# Patient Record
Sex: Male | Born: 1980 | Race: White | Hispanic: No | Marital: Married | Smoking: Former smoker
Health system: Southern US, Community
[De-identification: ages and names within clinical notes are randomized; demographics above are authoritative.]

## PROBLEM LIST (undated history)

## (undated) DIAGNOSIS — F419 Anxiety disorder, unspecified: Secondary | ICD-10-CM

## (undated) DIAGNOSIS — F329 Major depressive disorder, single episode, unspecified: Secondary | ICD-10-CM

## (undated) DIAGNOSIS — F32A Depression, unspecified: Secondary | ICD-10-CM

## (undated) HISTORY — DX: Major depressive disorder, single episode, unspecified: F32.9

## (undated) HISTORY — PX: OTHER SURGICAL HISTORY: SHX169

## (undated) HISTORY — DX: Depression, unspecified: F32.A

## (undated) HISTORY — DX: Anxiety disorder, unspecified: F41.9

---

## 2015-03-10 ENCOUNTER — Ambulatory Visit (INDEPENDENT_AMBULATORY_CARE_PROVIDER_SITE_OTHER): Payer: 59 | Admitting: Psychiatry

## 2015-03-10 ENCOUNTER — Encounter (INDEPENDENT_AMBULATORY_CARE_PROVIDER_SITE_OTHER): Payer: Self-pay

## 2015-03-10 ENCOUNTER — Encounter (HOSPITAL_COMMUNITY): Payer: Self-pay | Admitting: Psychiatry

## 2015-03-10 VITALS — BP 134/76 | HR 62 | Ht 66.0 in | Wt 207.0 lb

## 2015-03-10 DIAGNOSIS — F331 Major depressive disorder, recurrent, moderate: Secondary | ICD-10-CM

## 2015-03-10 DIAGNOSIS — F1010 Alcohol abuse, uncomplicated: Secondary | ICD-10-CM | POA: Diagnosis not present

## 2015-03-10 DIAGNOSIS — F1210 Cannabis abuse, uncomplicated: Secondary | ICD-10-CM | POA: Diagnosis not present

## 2015-03-10 DIAGNOSIS — F411 Generalized anxiety disorder: Secondary | ICD-10-CM

## 2015-03-10 MED ORDER — VENLAFAXINE HCL ER 37.5 MG PO CP24: ORAL_CAPSULE | ORAL | Status: DC

## 2015-03-10 NOTE — Progress Notes (Signed)
[facility] Behavioral Health Initial Assessment Note  Wesley Hudson [id number] 34 y.o. I need help.  I am very anxious and nervous. 03/10/2015 9:55 AM  Chief Complaint:  I need help I'm very anxious and nervous.  History of Present Illness:  Patient is 34 year old, Caucasian, married, employed man who was referred from his primary care physician for the management of depression and anxiety symptoms.  Patient endorse history of depression and anxiety as well as he remember however has been increased in recent years.  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  He is mainly working with limited staff and serving more than 1200 students.  In the beginning he was nervous and anxious and he has difficulty expressing his symptoms and stresses but later he was more cooperative.  He mentioned that his father died in 04-01-08 due to esophagus cancer and he had never grieved about it.  He also mentioned that his wife is [redacted] weeks pregnant he is very anxious and nervous about it.  This is her first baby and they have been married for 8 years.  He admitted stress at work causing a more depressed and anxious.  He admitted lately increase in his alcohol intake and marijuana.  He's been drinking 1-2 times a week and 3-4 times a week smoking marijuana.  He wants to stop his drugs and alcohol and he admitted that he is self-medicating to help his anxiety and nervousness.  He admitted crying spells, racing thoughts, lack of motivation, lack of attention and concentration, isolation and withdrawn.  He's been not enjoying his life which he used to by playing sports and fishing.  He has gained weight 15 pounds in past few months.  He denies any paranoia, hallucination, OCD, PTSD, but admitted some time vivid nightmares.  He denies any suicidal thoughts or homicidal thought.  He lives with his wife.  He had prescribed Zoloft last year but he stopped after 2 months because he does not see a huge  improvement.  Patient denies any mania, psychosis, aggression or violence.  He is open to try a new medication and counseling.  Suicidal Ideation: No Plan Formed: No Patient has means to carry out plan: No  Homicidal Ideation: No Plan Formed: No Patient has means to carry out plan: No  Past Psychiatric History/Hospitalization(s): Patient has history of seeing psychiatrist and therapist since age 47 when he was arrested for drug possession charges.  He was again arrested for DUI at age 67.  He was seeing Dr. Dub Mikes while he was in the school and prescribed Adderall.  Last year he was given Zoloft by his primary care physician but he stopped after 2 months.  Patient endorse history of anxiety, nervousness and depression but denies any history of mania, psychosis, hallucination, aggression or violence.  He has history of using drugs including marijuana and heavy history of alcohol. Anxiety: Yes Bipolar Disorder: No Depression: Yes Mania: No Psychosis: No Schizophrenia: No Personality Disorder: No Hospitalization for psychiatric illness: No History of Electroconvulsive Shock Therapy: No Prior Suicide Attempts: No  Medical History; Patient has no active medical problem.  His primary care physician is Dr. Clarene Duke.  He denies any history of seizures.  Traumatic brain injury: Patient denies any history of combative brain injury.  Family History; Patient endorsed multiple family member has depression and alcohol issues.  He endorse father and mother has depression.  He endorse uncle and aunt has alcohol problem.  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    Psychosocial History; Patient born in [place] but lived most of his life in [place].  His parents are well known in teaching.  His father diagnosed with esophageal cancer at early age and died in 03-31-08.  Patient has seen  his father suffering from cancer and he missed him a lot when he died.  His mother still in teaching program and currently living in [place] and involved in abroad teaching program.  Patient is married for 8 years.  His wife is expecting 6 weeks.  Patient has a younger sister who he get along very well with her.     Legal History: Patient was arrested in the past cause of drug possession and DUI.  Currently he is not on any probation.     History Of Abuse: Patient denies any history of abuse.   Substance Abuse History; Patient endorse history of drinking alcohol and smoking marijuana at early age.  He was arrested at age 60 and then again at age 42 .  He was seen psychiatrist Dr. Dub Mikes at [facility] .  He was given Adderall.    Review of Systems: Psychiatric: Agitation: No Hallucination: No Depressed Mood: Yes Insomnia: Yes Hypersomnia: No Altered Concentration: No Feels Worthless: No Grandiose Ideas: No Belief In Special Powers: No New/Increased Substance Abuse: Yes Compulsions: No  Neurologic: Headache: No Seizure: No Paresthesias: No   Musculoskeletal: Strength & Muscle Tone: within normal limits Gait & Station: normal Patient leans: N/A   Outpatient Encounter Prescriptions as of 03/10/2015  Medication Sig  . venlafaxine XR (EFFEXOR XR) 37.5 MG 24 hr capsule Take 1 capsule daily for 1 week and than 2 capsule daily    No results found for this or any previous visit (from the past 04-01-2159 hour(s)).    Constitutional:  BP 134/76 mmHg  Pulse 62  Ht  (1.676 m)  Wt 207 lb (93.895 kg)  BMI 33.43 kg/m2   Mental Status Examination;  Patient is well groomed well dressed man who appears to be in his stated age.  In the beginning he was anxious but later he was cooperative .  He maintained fair eye contact.  His his speech is clear, coherent, fluent with normal tone and volume.  He described his mood is anxious and his affect is constricted.  He was tearful during the  conversation when he was talking about his father.  He denies any auditory or visual hallucination.  He denies any active or passive suicidal thoughts or homicidal thought.  There were no delusions, paranoia or any obsessive thoughts.  Psychomotor activity is slow .  His fund of knowledge is adequate.  His thought process logical and goal-directed.  There were no flight of ideas or any loose association.  He has no EPS, tremors or shakes.  His cognition is good.  He is alert and oriented 3.  His insight judgment and impulse control is okay.     New problem, with additional work up planned, Review of Psycho-Social Stressors (1), Review or order clinical lab tests (1), Decision to obtain old records (1), Review and summation of old records (2), Established Problem, Worsening (2), New Problem, with no additional work-up planned (3), Review of Medication Regimen & Side Effects (2) and Review of New Medication or Change in Dosage (2)  Assessment: Axis I:  Major depressive disorder, recurrent  moderate.  Denies anxiety disorder.  Alcohol abuse, cannabis abuse  Axis II: Deferred  Axis III:  History reviewed. No pertinent past medical history.   Plan:   I review his symptoms, stressors, current medication and psychosocial history.  We will start low-dose Effexor to help his anxiety and depressive symptoms.  Discuss about drug and alcohol use and talk about slow recovery and interaction of a psychotropic medication.  We will also get records from his primary care physician , he has blood work in November.  I also believe patient requires counseling for increased coping and social skills.  We will schedule appointment with Scarlette CalicoFrances.  Start Effexor 37.5 mg per 1 week and gradually increase to 2 capsule a day .  Discussed medication side effects and benefits and recommended to call us back if he has any question, concern or if any worsening of the symptom.  Follow-up in 2-3 weeks Time spent 55 minutes.  More than  50% of the time spent in psychoeducation, counseling and coordination of care.  Discuss safety plan that anytime having active suicidal thoughts or homicidal thoughts then patient need to call 911 or go to the local emergency room.    Kripa Foskey T., MD 03/10/2015

## 2015-03-24 ENCOUNTER — Ambulatory Visit (INDEPENDENT_AMBULATORY_CARE_PROVIDER_SITE_OTHER): Payer: 59 | Admitting: Clinical

## 2015-03-24 ENCOUNTER — Encounter (HOSPITAL_COMMUNITY): Payer: Self-pay | Admitting: Clinical

## 2015-03-24 DIAGNOSIS — F411 Generalized anxiety disorder: Secondary | ICD-10-CM

## 2015-03-24 DIAGNOSIS — F330 Major depressive disorder, recurrent, mild: Secondary | ICD-10-CM | POA: Diagnosis not present

## 2015-03-24 NOTE — Progress Notes (Signed)
Patient:   Wesley Hudson   DOB:   08-24-1981  MR Number:  [id number]  Location:  [facility] BEHAVIORAL HE[facility] OUTPATIENT THERAPY [place] [id number] [place] [address] Dept: [phone]           Date of Service:   03/24/2015  Start Time:   8:01 End Time:   9:02  Provider/Observer:  Erby Pian Counselor       Billing Code/Service: [phone]  Behavioral Observation: Soren Lazarz  presents as a 34 y.o.-year-old White and Latino   Male who appeared his stated age. his dress was Appropriate and he was Negative, Neat and Well Groomed and his manners were Appropriate to the situation.  There were not any physical disabilities noted.  he displayed an appropriate level of cooperation and motivation.    Interactions:    Active   Attention:   normal  Memory:   normal  Speech (Volume):  normal  Speech:   normal pitch and normal volume  Thought Process:  Coherent and Relevant  Though Content:  WNL  Orientation:   person, place, time/date and situation  Judgment:   Good  Planning:   Good  Affect:    Appropriate  Mood:    Depressed  Insight:   Good  Intelligence:   normal  Chief Complaint:     Chief Complaint  Patient presents with  . Anxiety  . Depression  . Stress    Reason for Service:  Referred by Dr. 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 My father is from [place]. They lived in [place]. They wanted me to be born in [place] for various reasons." "After I was able to fly, we moved back to [place] and I grew up there until I was 5."  "I remember going to kindergarten in [place]. Also my sister born in [place]." "Then there was a lot of unrest there with the [employer], and the government. My parents felt it was unsafe to be in [place] any longer. My Dad was an Art gallery manager and we moved to [place]. We lived in [place] until 1993." "At that point Dad's health was declining, and his drinking and depression were a problem."  "When I was born Dad was diagnosed with throat cancer. He lived with it for 27 years. He had Tracheotomy and was in and out of the hospital a lot." "Also the school mom had been teaching at had gone through changes. So Dad sold his business to his partner, and Mom took a job at [employer] here in [place]. I was maybe 9  or [age] in age."  "I was angry to have to leave. Coming here was a mixture of some good things, making new friends, and struggling with stuff in the home."  "Dad was a quite drunk. He would drink too much and sleep on the couch.  Mother would get really mad and  yelling and fighting (non-violent)." "We moved into house I am currently in 1994, late summer. " I moved out when I was 17 and went to [employer] and spent first year on campus, then off campus."  "I now live with my wife. We are house sitting for Mom while she is teaching abroad. Being able to stay in her house has allowed us to  save up and plan for our family, we are really happy about that." Reuel BoomDaniel and Luz LexKeele are expecting their 1st child and Reuel BoomDaniel was promoted at work.  Natural/Informal Support:                          Mostly just myself,  Wife, sometimes Sam   Substance Use:  No concerns of substance abuse are reported.   Mariajuana use but limited use and alcohol intake is  limited, reports he is mindful because of past legal issues (5 years ago and at age 34) and because his father was an alcoholis   Medical History:  History reviewed. No pertinent past medical history.        Medication List       This list is accurate as of: 03/24/15  8:09 AM.  Always use your most recent med list.               venlafaxine XR 37.5 MG 24 hr capsule  Commonly known as:  EFFEXOR XR  Take 1 capsule daily for 1 week and than 2 capsule daily              Sexual History:   History  Sexual Activity  . Sexual Activity: Yes  . Birth Tax adviserControl/ Protection: None     Abuse/Trauma History: No childhood abuse     No abuse or trauma as an adult   Psychiatric History:  Outpatient - When in high school for anxiety and depression - a little helpful  Strengths:   'I am outgoing, I really like people and working with people, making relationships and working with others on goals and projects, humorous, I like to laugh, puns, and wit."    Recovery Goals:  "My goal is to be more incontrol of those emotional states and learn some coping mechanisms to work through that,   Hobbies/Interests:               "Reading, gaming, board word puzzle and video."   Challenges/Barriers: " Challenges for me will be sustaining those practices. Getting myself to follow through."    Family Med/Psych History:  Family History  Problem Relation Age of Onset  . Depression Mother   . Depression Father   . Depression Sister     Risk of Suicide/Violence: low  Denies any current or past suicidal ideation. As a teen sometimes thought would like to disappear.   History of Suicide/Violence:   No violence   Psychosis:   N/A  Diagnosis:    Generalized anxiety disorder  Major depressive disorder, recurrent episode, mild  Impression/DX:    [name] Biscoe  Is a 34 y.o.-year-old White and Latino, married Male who presents with Generalized Anxiety Disorder and Major Depressive Disorder recurrent  episode, mild. Kadden has a lot of new stressors such as a new job and his first child on the way which has increased his anxiety.  Orval reports that he has been experiencing anxiety since he was 8. He reports hat the anxiety began when he moved to [place] and was getting into fights with bullies. He reports " I would have a sense of impending doom on the way to school. I didn't mention it because I didn't want to add to the problems at home. I became more depressed in high school. That is also when I began to drink and smoke weed. I guess I was self medicating and that's how I dealt with those things." In my Freshman/Sophomore year at [employer] I got in trouble for drinking. I had to go to a class and also went to see Dr. Dub Mikes. He was helpful. I was also given Adderall. hey thought I had  ADHD. The Adderall helped me study and concentrate but I don't think I had ADHD. Anyway I did not persist on the medication or seeing Dr Dub Mikes. I was paying for myself by then and the cost was a problem. My parents had their own stuff going on so I tried not to burden them. I tried not to be a burden as to be no burden at all. Child reports the following anxiety symptoms: worry more than half the day, sometimes it is hard to control the worry, fatigued. He reports that he has had some panic attacks but that they are not consist ant. He reports the last one happened in January. He reports the following symptoms of panic attack chest gets tight, sweaty palms and feet. "I feel like  I am really struggling to survive."   Jamarea reports that he has experienced "a constant under the surface depression , leading up to and following my father's death." He reports while the anxiety is the main problem he does sometimes experience the following symptoms, sometimes a feeling of hopelessness, sometimes feelings of guilt about past decisions or what I should have done, an increased desire for sleep, "Ive gained weight since starting my new  job, which has also cut down on my exercise."  Denies Mania or psychosis  Recommendation/Plan: Individual therapy 1x a week to become less frequent as symptoms decrease, and follow safety plan as needed

## 2015-03-30 ENCOUNTER — Ambulatory Visit (INDEPENDENT_AMBULATORY_CARE_PROVIDER_SITE_OTHER): Payer: 59 | Admitting: Psychiatry

## 2015-03-30 ENCOUNTER — Encounter (HOSPITAL_COMMUNITY): Payer: Self-pay | Admitting: Psychiatry

## 2015-03-30 VITALS — BP 128/75 | HR 54 | Ht 72.0 in | Wt 208.6 lb

## 2015-03-30 DIAGNOSIS — F1210 Cannabis abuse, uncomplicated: Secondary | ICD-10-CM | POA: Diagnosis not present

## 2015-03-30 DIAGNOSIS — Z79899 Other long term (current) drug therapy: Secondary | ICD-10-CM

## 2015-03-30 DIAGNOSIS — F411 Generalized anxiety disorder: Secondary | ICD-10-CM

## 2015-03-30 DIAGNOSIS — F331 Major depressive disorder, recurrent, moderate: Secondary | ICD-10-CM | POA: Diagnosis not present

## 2015-03-30 DIAGNOSIS — F1010 Alcohol abuse, uncomplicated: Secondary | ICD-10-CM | POA: Diagnosis not present

## 2015-03-30 MED ORDER — VENLAFAXINE HCL ER 75 MG PO CP24: 75.0000 mg | ORAL_CAPSULE | Freq: Every day | ORAL | Status: DC

## 2015-03-30 NOTE — Progress Notes (Signed)
[facility] Behavioral Health [id number] progress Note  Wesley Hudson [id number] 34 y.o.   04/04/2015 3:35 PM  Chief Complaint:   I like Effexor.  History of Present Illness:   Wesley Hudson came for his follow-up appointment. He was seen first time on April 12 as initial evaluation.  He was complaining of increased stress, anxiety, nervousness and depression. His major stressors are difficulty dealing with the loss of his father and stressful job.  He also mentioned at that time his wife is [redacted] weeks pregnant.  We started him on Effexor and he is taking 75 mg.  He has noticed improvement in his attention , concentration, depression, and anxiety symptoms.  He cut down his drinking and marijuana use. He is more social and active.  He start walking every day with his wife and he has noticed improvement in his behavior with his wife. He mentioned job still stressful but he is handling much better. He started seeing Wesley Hudson  And admitted somewhat emotional and tearful when he saw her first time.  However he is getting there slowly.  He denies any side effects including any rash or any tremors. He denies any paranoia, hallucination, OCD or any nightmares. He is tolerating 75 mg without any side effects. His wife is now [redacted] weeks pregnant and is happy things are going very well. We reviewed collateral information from his primary care physician.  He has blood work back in December 2014 which shows high LDL and triglycerides. His CBC and basic chemistry was normal.  Patient endorse better energy level.  His appetite is okay.  His vitals are stable.  Suicidal Ideation: No Plan Formed: No Patient has means to carry out plan: No  Homicidal Ideation: No Plan Formed: No Patient has means to carry out plan: No  Past Psychiatric History/Hospitalization(s): Patient has history of seeing psychiatrist and therapist since age 34 when he was arrested for drug possession charges.   He had history of DUI at age 34.[name]  He was seeing Dr. Dub Hudson  while he was in the school and prescribed Adderall.   He was given Zoloft by his primary care physician but he stopped after 2 months.  Patient endorse history of anxiety, nervousness and depression but denies any history of mania, psychosis, hallucination, aggression or violence.  He has history of using drugs including marijuana and heavy history of alcohol. Anxiety: Yes Bipolar Disorder: No Depression: Yes Mania: No Psychosis: No Schizophrenia: No Personality Disorder: No Hospitalization for psychiatric illness: No History of Electroconvulsive Shock Therapy: No Prior Suicide Attempts: No  Medical History; Patient has no active medical problem.  His primary care physician is Dr. Clarene Hudson.  He denies any history of seizures.  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    Psychosocial History; Patient born in [place] but lived most of his life in [place].  His parents are well known in teaching.  His father diagnosed with esophageal cancer at early age and died in 04-03-2008.  Patient has seen his father suffering from cancer and he missed him a lot when he died.  His mother still in teaching program and currently living in [place] and involved in abroad teaching program.  Patient is married for 8 years.  His wife is expecting 6 weeks.  Patient has a younger sister who he get along very well with her.     Review of Systems  Constitutional:  Negative.   HENT: Negative.   Skin: Negative.   Neurological: Negative for tremors.  Psychiatric/Behavioral: Positive for substance abuse. Negative for depression, suicidal ideas and hallucinations. The patient is nervous/anxious. The patient does not have insomnia.     Psychiatric: Agitation: No Hallucination: No Depressed Mood: No Insomnia: No Hypersomnia: No Altered Concentration: No Feels Worthless: No Grandiose Ideas: No Belief  In Special Powers: No New/Increased Substance Abuse: Yes Compulsions: No  Neurologic: Headache: No Seizure: No Paresthesias: No   Musculoskeletal: Strength & Muscle Tone: within normal limits Gait & Station: normal Patient leans: N/A   Outpatient Encounter Prescriptions as of 03/30/2015  . Order #: [id number] Class: Normal  . [DISCONTINUED] Order #: [id number] Class: Normal    No results found for this or any previous visit (from the past 2160 hour(s)).    Constitutional:  BP 128/75 mmHg  Pulse 54  Ht 6' (1.829 m)  Wt 208 lb 9.6 oz (94.62 kg)  BMI 28.28 kg/m2   Mental Status Examination;  Patient is well groomed well dressed man who appears to be in his stated age.  He described his mood better and his affect is improved from the past. He maintained good eye contact. His speech is clear, coherent, fluent with normal tone and volume.  He denies any auditory or visual hallucination.  He denies any active or passive suicidal thoughts or homicidal thought.  There were no delusions, paranoia or any obsessive thoughts.  Psychomotor activity is normal. His fund of knowledge is adequate.  His thought process logical and goal-directed.  There were no flight of ideas or any loose association.  He has no EPS, tremors or shakes.  His cognition is good.  He is alert and oriented 3.  His insight judgment and impulse control is okay.     Established Problem, Stable/Improving (1), Review of Psycho-Social Stressors (1), Review or order clinical lab tests (1), Review and summation of old records (2), Review of Last Therapy Session (1) and Review of Medication Regimen & Side Effects (2)  Assessment: Axis I:  Major depressive disorder, recurrent moderate.  Denies anxiety disorder.  Alcohol abuse, cannabis abuse  Axis II: Deferred  Axis III:  History reviewed. No pertinent past medical history.   Plan:  Patient is doing better on Effexor 75 mg daily. He has no side effects. I reviewed blood  work results from his primary care physician which was done in December 2014. I will order new blood work including CBC, CMP, hemoglobin A1c, TSH and lipid panel. Discussed medication side effects and benefits. Encouraged to stop marijuana and alcohol use.  Encouraged to keep appointment with Wesley CalicoFrances for coping and social skills.  At this time no further adjustment into his Effexor however we will consider increasing the dose on his next appointment.  Recommended to call us back if he has any question or any concern.  Follow-up in 6 weeks.  Time spent 25 minutes. More than 50% of the time spent in psychoeducation, counseling and coordination of care.  Discuss safety plan that anytime having active suicidal thoughts or homicidal thoughts then patient need to call 911 or go to the local emergency room.    Makaylin Carlo T., MD 03/30/2015

## 2015-04-13 ENCOUNTER — Ambulatory Visit (INDEPENDENT_AMBULATORY_CARE_PROVIDER_SITE_OTHER): Payer: 59 | Admitting: Clinical

## 2015-04-13 ENCOUNTER — Encounter (HOSPITAL_COMMUNITY): Payer: Self-pay | Admitting: Clinical

## 2015-04-13 DIAGNOSIS — F330 Major depressive disorder, recurrent, mild: Secondary | ICD-10-CM | POA: Diagnosis not present

## 2015-04-13 DIAGNOSIS — F411 Generalized anxiety disorder: Secondary | ICD-10-CM

## 2015-04-13 NOTE — Progress Notes (Signed)
   THERAPIST PROGRESS NOTE  Session Time: 8:18 -8:58  Participation Level: Active  Behavioral Response: CasualAlertNA  Type of Therapy: Individual Therapy  Treatment Goals addressed: improve psychiatric symptoms. emotional regulations skills, Improve unhealthy thought patterns,    Interventions: CBT, Motivational Interviewing, Grounding and Mindfulness Techniques  Summary: Wesley Hudson is a 34 y.o. male who presents with Generalized Anxiety Disorder, Major Depressive Disorder, recurrent, mild.   Suicidal/Homicidal: No -without intent/plan  Therapist Response:  [name] met with clinician for an individual session. He shared about his psychiatric symptoms and current life events. 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 Bellamy reported that his symptoms are the same as before his assessment. Clinician introduced grounding techniques. Client and clinician discussed the process and the purpose. Client and clinician practiced some of the techniques together. Branko agreed to practice 2 techniques daily until next session. Clinician introduced some cbt concepts. Bassem agreed to complete some cbt homework on anxiety.  Plan: Return again in 2 weeks.  Diagnosis:     Axis I: Generalized Anxiety Disorder, Major Depressive Disorder, recurrent, mild      Sherica Paternostro A, LCSW 04/13/2015

## 2015-04-21 ENCOUNTER — Ambulatory Visit (INDEPENDENT_AMBULATORY_CARE_PROVIDER_SITE_OTHER): Payer: 59 | Admitting: Clinical

## 2015-04-21 ENCOUNTER — Encounter (HOSPITAL_COMMUNITY): Payer: Self-pay | Admitting: Clinical

## 2015-04-21 DIAGNOSIS — F411 Generalized anxiety disorder: Secondary | ICD-10-CM

## 2015-04-21 DIAGNOSIS — F330 Major depressive disorder, recurrent, mild: Secondary | ICD-10-CM

## 2015-04-21 NOTE — Psych (Signed)
   THERAPIST PROGRESS NOTE  Session Time: 4:45 -5:30  Participation Level: Active  Behavioral Response: CasualAlertAnxious  Type of Therapy: Individual Therapy  Treatment Goals addressed: improve psychiatric symptoms. emotional regulations skills, Improve unhealthy thought patterns,    Interventions: CBT, Motivational Interviewing, Grounding and Mindfulness Techniques  Summary: Wesley Hudson is a 34 y.o. male who presents with Generalized Anxiety Disorder, Major Depressive Disorder, recurrent, mild.   Suicidal/Homicidal: No -without intent/plan  Therapist Response:  Wesley Hudson met with clinician for an individual session. He shared about his psychiatric symptoms, current life events and his homework. 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 Wesley Hudson and clinician reviewed and discussed his grounding/mindfulness and cbt homework focused on anxiety. Wesley Hudson shared his thoughts and insights about the homework.Client and clinician discussed some cbt concepts. Clinician introduced a 7 panel cbt thought record sheet. Wesley Hudson and clinician discussed one of Wesley Hudson's fears. Client and clinician used it as an example of how to use the worksheet. Client and clinician worked it together. Wesley Hudson identified the trigger, his automatic thoughts, the evidence that did and did not support the thoughts and for homework Wesley Hudson is going to formulate healthier alternative thoughts. Wesley Hudson agreed to continue practicing his grounding techniques and his cbt packet homework.  Plan: Return again in 2 weeks.  Diagnosis:     Axis I: Generalized Anxiety Disorder, Major Depressive Disorder, recurrent, mild    Wesley Hudson A, LCSW 04/21/2015

## 2015-05-11 ENCOUNTER — Encounter (HOSPITAL_COMMUNITY): Payer: Self-pay | Admitting: Psychiatry

## 2015-05-11 ENCOUNTER — Ambulatory Visit (INDEPENDENT_AMBULATORY_CARE_PROVIDER_SITE_OTHER): Payer: 59 | Admitting: Psychiatry

## 2015-05-11 VITALS — BP 118/82 | HR 55 | Ht 71.5 in | Wt 206.8 lb

## 2015-05-11 DIAGNOSIS — F1210 Cannabis abuse, uncomplicated: Secondary | ICD-10-CM

## 2015-05-11 DIAGNOSIS — F1010 Alcohol abuse, uncomplicated: Secondary | ICD-10-CM | POA: Diagnosis not present

## 2015-05-11 DIAGNOSIS — F411 Generalized anxiety disorder: Secondary | ICD-10-CM

## 2015-05-11 DIAGNOSIS — F331 Major depressive disorder, recurrent, moderate: Secondary | ICD-10-CM

## 2015-05-11 MED ORDER — VENLAFAXINE HCL ER 75 MG PO CP24: 75.0000 mg | ORAL_CAPSULE | Freq: Every day | ORAL | Status: DC

## 2015-05-11 NOTE — Progress Notes (Signed)
Patient ID: Wesley Hudson, male   DOB: [birth year], 34 y.o.   MRN: [id number] Encounter opened in error as patient no showed for appointment.

## 2015-05-11 NOTE — Progress Notes (Signed)
[facility] [id number] progress Note  Wesley Hudson [id number] 33 y.o.   05/11/2015 4:36 PM  Chief Complaint:  Medication management and follow-up.   History of Present Illness:  Wesley Hudson came for his follow-up appointment.  He is taking Effexor and denies any side effects.  He likes his medication and since he started Effexor he has no major anicteric attack or anxiety attack.  He has stopped marijuana and drinking.  His wife is now [redacted] weeks pregnant.  His wife is due [employer] of December.  Patient told recently he has vacation at the beach .  He denies any feeling of hopelessness or worthlessness.  His energy level is good.  He sleeping good.  He is seeing Thelma Barge for coping and social skills.  He has no tremors, shakes or any side effects.  Patient is scheduled to see his primary care physician on Monday and he will do blood work.  He likes his job.  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  Suicidal Ideation: No Plan Formed: No Patient has means to carry out plan: No  Homicidal Ideation: No Plan Formed: No Patient has means to carry out plan: No  Past Psychiatric History/Hospitalization(s): Patient has history of seeing psychiatrist and therapist since age 41 when he was arrested for drug possession charges.   He had history of DUI at age 40.[name]  He was seeing Dr. Dub Mikes while he was in the school and prescribed Adderall.   He was given Zoloft by his primary care physician but he stopped after 2 months.  Patient endorse history of anxiety, nervousness and depression but denies any history of mania, psychosis, hallucination, aggression or violence.  He has history of using drugs including marijuana and heavy history of alcohol. Anxiety: Yes Bipolar Disorder: No Depression: Yes Mania: No Psychosis: No Schizophrenia: No Personality Disorder: No Hospitalization for psychiatric illness: No History of Electroconvulsive Shock Therapy: No Prior Suicide  Attempts: No  Medical History; Patient has no active medical problem.  His primary care physician is Dr. Clarene Duke.  He denies any history of seizures.  Review of Systems  Constitutional: Negative.   HENT: Negative.   Skin: Negative.   Neurological: Negative for tremors.  Psychiatric/Behavioral: Negative for depression, suicidal ideas and hallucinations. The patient does not have insomnia.     Psychiatric: Agitation: No Hallucination: No Depressed Mood: No Insomnia: No Hypersomnia: No Altered Concentration: No Feels Worthless: No Grandiose Ideas: No Belief In Special Powers: No New/Increased Substance Abuse: No Compulsions: No  Neurologic: Headache: No Seizure: No Paresthesias: No   Musculoskeletal: Strength & Muscle Tone: within normal limits Gait & Station: normal Patient leans: N/A   Outpatient Encounter Prescriptions as of 05/11/2015  Medication Sig  . venlafaxine XR (EFFEXOR-XR) 75 MG 24 hr capsule Take 1 capsule (75 mg total) by mouth daily.  . [DISCONTINUED] venlafaxine XR (EFFEXOR-XR) 75 MG 24 hr capsule Take 1 capsule (75 mg total) by mouth daily.   No facility-administered encounter medications on file as of 05/11/2015.    No results found for this or any previous visit (from the past 2160 hour(s)).    Constitutional:  BP 118/82 mmHg  Pulse 55  Ht 5' 11.5" (1.816 m)  Wt 206 lb 12.8 oz (93.804 kg)  BMI 28.44 kg/m2   Mental Status Examination;  Patient is well groomed well dressed man who appears to be in his stated age.  He described his mood pleasant and his affect is appropriate.  He maintained good eye contact. His speech is clear, coherent, fluent with normal tone and volume.  He denies any auditory or visual hallucination.  He denies any active or passive suicidal thoughts or homicidal thought.  There were no delusions, paranoia or any obsessive thoughts.  Psychomotor activity is normal. His fund of knowledge is adequate.  His thought process  logical and goal-directed.  There were no flight of ideas or any loose association.  He has no EPS, tremors or shakes.  His cognition is good.  He is alert and oriented 3.  His insight judgment and impulse control is okay.     Established Problem, Stable/Improving (1), Review of Last Therapy Session (1) and Review of Medication Regimen & Side Effects (2)  Assessment: Axis I:  Major depressive disorder, recurrent moderate.  Denies anxiety disorder.  Alcohol abuse, cannabis abuse  Axis II: Deferred  Axis III:  Past Medical History  Diagnosis Date  . Anxiety   . Depression      Plan:  Patient is a stable on his current medication.  I will continue Effexor 75 mg daily.  Patient will get his blood work when he sees primary care physician in few days.  Discussed medication side effects and benefits.  Recommended to call us back if he has any question, concern or if any worsening of the symptom.  Encouraged to see Thelma Barge for coping and social skills.  Follow-up in 3 months.   ARFEEN,SYED T., MD 05/11/2015

## 2015-06-09 ENCOUNTER — Ambulatory Visit (INDEPENDENT_AMBULATORY_CARE_PROVIDER_SITE_OTHER): Payer: 59 | Admitting: Clinical

## 2015-06-09 DIAGNOSIS — F330 Major depressive disorder, recurrent, mild: Secondary | ICD-10-CM

## 2015-06-09 DIAGNOSIS — F411 Generalized anxiety disorder: Secondary | ICD-10-CM

## 2015-06-09 NOTE — Progress Notes (Signed)
   THERAPIST PROGRESS NOTE  Session Time: 4:33 -5:29  Participation Level: Active  Behavioral Response: CasualAlertDepressed   Type of Therapy: Individual Therapy  Treatment Goals addressed: improve psychiatric symptoms. emotional regulations skills, Improve unhealthy thought patterns,    Interventions: CBT, Motivational Interviewing  Summary: Wesley Hudson is a 34 y.o. male who presents with Generalized Anxiety Disorder, Major Depressive Disorder, recurrent, mild.   Suicidal/Homicidal: No -without intent/plan  Therapist Response:  [name] met with clinician for an individual session. He shared about his psychiatric symptoms, current life events and his homework. Page shared that he had completed his homework. Casin discussed his experience with anxiety and depression since his last session. He shared about his grounding and mindfulness practice. He shared what was working for him and what was not. Client and clinician discussed adjustments he could make to improve his practice. [name] shared his cbt homework from last session. He shared his thoughts and insights from his homework.Silas was teary as he shared about his father (who is passed) in relationship to his unborn child. Minard identified beliefs he had formulated from events in his life. He had the insight that he was living as if these beliefs were true without evidence to support them. Client and clinician discussed the process of challenging these thoughts. Client and clinician discussed the things that are challenging about changing a thought. Isamar agreed to continue his anxiety, and grounding/mindfulness homework until next session. Michaeljoseph shared that it felt good to be able to speak about his Father and to be able to change some of his unhelpful beliefs.    Plan: Return again in 2 weeks.  Diagnosis:     Axis I: Generalized Anxiety Disorder, Major Depressive Disorder, recurrent, mild    Thadius Smisek A, LCSW 06/09/2015

## 2015-06-13 ENCOUNTER — Encounter (HOSPITAL_COMMUNITY): Payer: Self-pay | Admitting: Clinical

## 2015-07-01 ENCOUNTER — Ambulatory Visit (HOSPITAL_COMMUNITY): Payer: Self-pay | Admitting: Psychiatry

## 2015-07-08 ENCOUNTER — Ambulatory Visit (INDEPENDENT_AMBULATORY_CARE_PROVIDER_SITE_OTHER): Payer: 59 | Admitting: Clinical

## 2015-07-08 ENCOUNTER — Encounter (HOSPITAL_COMMUNITY): Payer: Self-pay | Admitting: Clinical

## 2015-07-08 DIAGNOSIS — F411 Generalized anxiety disorder: Secondary | ICD-10-CM

## 2015-07-08 DIAGNOSIS — F330 Major depressive disorder, recurrent, mild: Secondary | ICD-10-CM | POA: Diagnosis not present

## 2015-07-08 NOTE — Progress Notes (Signed)
   THERAPIST PROGRESS NOTE  Session Time: 8:06 -8:58  Participation Level: Active  Behavioral Response: CasualAlertAnxious  Type of Therapy: Individual Therapy  Treatment Goals addressed: improve psychiatric symptoms. emotional regulations skills, Improve unhealthy thought patterns,    Interventions: CBT, Motivational Interviewing, Grounding and Mindfulness Techniques  Summary: Wesley Hudson is a 34 y.o. male who presents with Generalized Anxiety Disorder, Major Depressive Disorder, recurrent, mild.   Suicidal/Homicidal: No -without intent/plan  Therapist Response:  [name] met with clinician for an individual session. He shared about his psychiatric symptoms, current life events and his homework. Keynan shared that he had completed his homework. Client and clinician reviewed and discussed his homework. Hall shared his thoughts and insights from his homework. 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 He shared that there is a lot to do to prepare for the students, Timithy's anxiety as it related to his job. Mase shared that he had been using his mindfulness and grounding techniques. Client and clinician discussed what was working with them and what could improve.  Etai shared that his thoughts and beliefs about his father are shifting. He shared that this is also allowing him to change some of his thoughts and fears about himself and his role as a father. Jionni shed some tears. These were happy tears. He shared that it felt good to be able to express his feelings and to "see things differently". He shared that it felt good to shed a tear and know that it was all right for him to express his emotions. Kaiea agreed to continue his homework until next session.   Plan: Return again in 2 weeks.  Diagnosis:     Axis I: Generalized Anxiety Disorder, Major Depressive Disorder, recurrent, mild    Alixandra Alfieri A, LCSW 07/08/2015

## 2015-08-04 ENCOUNTER — Ambulatory Visit (INDEPENDENT_AMBULATORY_CARE_PROVIDER_SITE_OTHER): Payer: 59 | Admitting: Clinical

## 2015-08-04 ENCOUNTER — Encounter (HOSPITAL_COMMUNITY): Payer: Self-pay | Admitting: Clinical

## 2015-08-04 DIAGNOSIS — F330 Major depressive disorder, recurrent, mild: Secondary | ICD-10-CM

## 2015-08-04 DIAGNOSIS — F411 Generalized anxiety disorder: Secondary | ICD-10-CM | POA: Diagnosis not present

## 2015-08-04 NOTE — Progress Notes (Signed)
   THERAPIST PROGRESS NOTE  Session Time: 8:01 - 8:56  Participation Level: Active  Behavioral Response: CasualAlertAnxious   Type of Therapy: Individual Therapy  Treatment Goals addressed: improve psychiatric symptoms. emotional regulations skills, Improve unhealthy thought patterns,    Interventions: CBT, Motivational Interviewing, Grounding and Mindfulness Techniques  Summary: Wesley Hudson is a 34 y.o. male who presents with Generalized Anxiety Disorder, Major Depressive Disorder, recurrent, mild.   Suicidal/Homicidal: No -without intent/plan  Therapist Response:  [name] met with clinician for an individual session. He shared about his psychiatric symptoms, current life events and his homework. Claudis shared that he had noticed some improvement in his mood. He shared that he has felt more emotionally stable. He stated that his medication and emotional regulation skills practice are working for him. He shared that he continues to do his grounding and mindfulness techniques and finds them helpful. Moiz shared that his mother and sister would be home in December and he was thinking about asking them if they would like to do something to honor his father. He shared that his anxiety about being a father is shifting as his perspective on his father shifts. Vale shared that a lot of his anxiety about work had to do with messages he has received throughout his life about being successful.Clinician asked Dartanion to define success for himself. Braelin did not have a definition yet and agreed to consider it.Client and clinician discussed how we can carry beliefs that do not fit what is true for [place]. Client and clinician discussed the process of challenging beliefs. radin raptis continue his homework until next session.    Plan: Return again in 3-4 weeks.  Diagnosis:     Axis I: Generalized Anxiety Disorder, Major Depressive Disorder, recurrent, mild    Finneas Mathe A, LCSW 08/04/2015

## 2015-08-11 ENCOUNTER — Encounter (HOSPITAL_COMMUNITY): Payer: Self-pay | Admitting: Psychiatry

## 2015-08-11 ENCOUNTER — Ambulatory Visit (INDEPENDENT_AMBULATORY_CARE_PROVIDER_SITE_OTHER): Payer: 59 | Admitting: Psychiatry

## 2015-08-11 VITALS — BP 133/72 | HR 61 | Ht 71.75 in | Wt 202.0 lb

## 2015-08-11 DIAGNOSIS — F1010 Alcohol abuse, uncomplicated: Secondary | ICD-10-CM

## 2015-08-11 DIAGNOSIS — F419 Anxiety disorder, unspecified: Secondary | ICD-10-CM | POA: Diagnosis not present

## 2015-08-11 DIAGNOSIS — F331 Major depressive disorder, recurrent, moderate: Secondary | ICD-10-CM

## 2015-08-11 DIAGNOSIS — F1210 Cannabis abuse, uncomplicated: Secondary | ICD-10-CM

## 2015-08-11 DIAGNOSIS — F411 Generalized anxiety disorder: Secondary | ICD-10-CM

## 2015-08-11 MED ORDER — VENLAFAXINE HCL ER 75 MG PO CP24: 75.0000 mg | ORAL_CAPSULE | Freq: Every day | ORAL | Status: DC

## 2015-08-11 NOTE — Progress Notes (Signed)
[facility] [id number] progress Note  Wesley Hudson [id number] 34 y.o.   08/11/2015 9:13 AM  Chief Complaint:  Medication management and follow-up.   History of Present Illness:  Wesley Hudson came for his follow-up appointment.  He is a stable on Effexor.  He denies any major panic attack.  He is happy his wife is due in first week of December.  He feels ready for it.  He denies any irritability, anger, mood swing.  He has significantly cut down his drinking and marijuana.  His last use was 3 weeks ago when he was with his friends but denies any intoxication .  His job is going very well.  He denies any feeling of hopelessness or worthlessness.  She denies any crying spells and he feels Effexor is helping his anxiety and depression.  He is seeing Wesley Hudson for coping and social skills and he noticed a remarkable improvement in his symptoms with counseling.  He has no tremors or shakes.  He denies any paranoia or any hallucination.  His appetite is okay.  His vitals are stable.  He had blood work at his primary care physician however we do not have any dissociative available.  Patient promised that he will call his primary care physician to fax the blood work results to [place].  He wants to continue his Effexor.  His appetite is okay.  His vitals are stable.  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  Suicidal Ideation: No Plan Formed: No Patient has means to carry out plan: No  Homicidal Ideation: No Plan Formed: No Patient has means to carry out plan: No  Past Psychiatric History/Hospitalization(s): Patient has seen psychiatrist and therapist since age 34 when he was arrested for drug possession charges.   He had history of DUI at age 34.[name]  He was seeing Dr. Dub Hudson while he was in the school and prescribed Adderall.   He was given Zoloft by his primary care physician but he stopped after 2 months.  Patient endorse history of anxiety, nervousness and depression but denies  any history of mania, psychosis, hallucination, aggression or violence.  He has history of using drugs including marijuana and heavy history of alcohol. Anxiety: Yes Bipolar Disorder: No Depression: Yes Mania: No Psychosis: No Schizophrenia: No Personality Disorder: No Hospitalization for psychiatric illness: No History of Electroconvulsive Shock Therapy: No Prior Suicide Attempts: No  Medical History; Patient has no active medical problem.  His primary care physician is Dr. Clarene Hudson.  He denies any history of seizures.  Review of Systems  Constitutional: Negative.   HENT: Negative.   Skin: Negative.   Neurological: Negative for tremors.  Psychiatric/Behavioral: Negative for depression, suicidal ideas and hallucinations. The patient does not have insomnia.     Psychiatric: Agitation: No Hallucination: No Depressed Mood: No Insomnia: No Hypersomnia: No Altered Concentration: No Feels Worthless: No Grandiose Ideas: No Belief In Special Powers: No New/Increased Substance Abuse: No Compulsions: No  Neurologic: Headache: No Seizure: No Paresthesias: No   Musculoskeletal: Strength & Muscle Tone: within normal limits Gait & Station: normal Patient leans: N/A   Outpatient Encounter Prescriptions as of 08/11/2015  Medication Sig  . venlafaxine XR (EFFEXOR-XR) 75 MG 24 hr capsule Take 1 capsule (75 mg total) by mouth daily.  . [DISCONTINUED] venlafaxine XR (EFFEXOR-XR) 75 MG 24 hr capsule Take 1 capsule (75 mg total) by mouth daily.   No facility-administered encounter medications on file as of 08/11/2015.    No results  found for this or any previous visit (from the past 2160 hour(s)).    Constitutional:  BP 133/72 mmHg  Pulse 61  Ht 5' 11.75" (1.822 m)  Wt 202 lb (91.627 kg)  BMI 27.60 kg/m2   Mental Status Examination;  Patient is well groomed well dressed man who appears to be in his stated age.  He described his mood pleasant and his affect is appropriate.   He maintained good eye contact. His speech is clear, coherent, fluent with normal tone and volume.  He denies any auditory or visual hallucination.  He denies any active or passive suicidal thoughts or homicidal thought.  There were no delusions, paranoia or any obsessive thoughts.  Psychomotor activity is normal. His fund of knowledge is adequate.  His thought process logical and goal-directed.  There were no flight of ideas or any loose association.  He has no EPS, tremors or shakes.  His cognition is good.  He is alert and oriented 3.  His insight judgment and impulse control is okay.     Established Problem, Stable/Improving (1), Review or order clinical lab tests (1), Review of Last Therapy Session (1) and Review of Medication Regimen & Side Effects (2)  Assessment: Axis I:  Major depressive disorder, recurrent moderate.  Anxiety disorder.  Alcohol abuse, cannabis abuse  Axis II: Deferred  Axis III:  Past Medical History  Diagnosis Date  . Anxiety   . Depression      Plan:  Patient is a stable on his current medication.  I will continue Effexor 75 mg daily.  Recommended to have his blood work results faxed to [place] from his primary care physician.  Discuss alcohol abuse and marijuana abuse in detail.  Discuss interaction of drugs and alcohol with psychiatric medication.  He has cut down significantly from the past and hoping to stop completely in the future. Recommended to call us back if he has any question, concern or if any worsening of the symptom.  Encouraged to see Wesley Hudson for coping and social skills.  Follow-up in 3 months.   Wesley Molla T., MD 08/11/2015

## 2015-08-31 ENCOUNTER — Ambulatory Visit (INDEPENDENT_AMBULATORY_CARE_PROVIDER_SITE_OTHER): Payer: 59 | Admitting: Clinical

## 2015-08-31 ENCOUNTER — Encounter (HOSPITAL_COMMUNITY): Payer: Self-pay | Admitting: Clinical

## 2015-08-31 DIAGNOSIS — F331 Major depressive disorder, recurrent, moderate: Secondary | ICD-10-CM

## 2015-08-31 DIAGNOSIS — F411 Generalized anxiety disorder: Secondary | ICD-10-CM | POA: Diagnosis not present

## 2015-08-31 NOTE — Progress Notes (Signed)
THERAPIST PROGRESS NOTE  Session Time: 3:45 -5:30  Participation Level: Active  Behavioral Response: CasualAlertgrief  Type of Therapy: Individual Therapy  Treatment Goals addressed: improve psychiatric symptoms. emotional regulations skills, Improve unhealthy thought patterns,    Interventions: CBT, Motivational Interviewing,  Summary: Wesley Hudson is a 34 y.o. male who presents with Generalized Anxiety Disorder, Major Depressive Disorder, recurrent, mild.   Suicidal/Homicidal: No -without intent/plan  Therapist Response:  [name] met with clinician for an individual session. He shared about his psychiatric symptoms, his current life events and his homework. Codee shared that while he is getting better at regulating his emotions he is experiencing both depression and anxiety lately. He shared that now that school was back in session the pressure is high again. He stated he is still working on defining success for himself. He stated that he finds it challenging to differentiate between the messages he received growing up and his own beliefs. He also shared that he continues to use his grounding and mindfulness techniques. He shared that it is close to the anniversary of his father's death. He shared that as his perspective about his father shifts he is experiencing new grief. Nieko shared about his grief and how it relates to his experience with depression. Pal stated that as he processes the grief about his father, he has also been experiencing feelings of anger at other family members. When asked open ended questions Suhayb had the insight that his anger comes from a place of hurt and sadness. Iasiah shead some tears as he spoke about the events that hurt and saddened him. Caedon and clinician discussed how to continue to process his feelings. Tristen and clinician reviewed how to challenge his thoughts. Koda shared that it felt good to him to be able to discuss the things that hurt him  because his perspective and acceptance of them change after he does so. Client and clinician agreed to discuss it further at future sessions. Brady agreed to continue to do his homework.   Plan: Return again in 3-4 weeks.  Diagnosis:     Axis I: Generalized Anxiety Disorder, Major Depressive Disorder, recurrent, mild   Laiya Wisby A, LCSW 08/31/2015

## 2015-09-24 ENCOUNTER — Ambulatory Visit (HOSPITAL_COMMUNITY): Payer: Self-pay | Admitting: Clinical

## 2015-09-28 ENCOUNTER — Ambulatory Visit (HOSPITAL_COMMUNITY): Payer: Self-pay | Admitting: Clinical

## 2015-10-27 ENCOUNTER — Encounter (HOSPITAL_COMMUNITY): Payer: Self-pay | Admitting: Clinical

## 2015-10-27 ENCOUNTER — Ambulatory Visit (INDEPENDENT_AMBULATORY_CARE_PROVIDER_SITE_OTHER): Payer: 59 | Admitting: Clinical

## 2015-10-27 DIAGNOSIS — F411 Generalized anxiety disorder: Secondary | ICD-10-CM

## 2015-10-27 DIAGNOSIS — F330 Major depressive disorder, recurrent, mild: Secondary | ICD-10-CM | POA: Diagnosis not present

## 2015-10-27 NOTE — Progress Notes (Signed)
   THERAPIST PROGRESS NOTE  Session Time: 4:32 -5:30  Participation Level: Active  Behavioral Response: CasualAlertAnxious  Type of Therapy: Individual Therapy  Treatment Goals addressed: improve psychiatric symptoms. emotional regulations skills, Improve unhealthy thought patterns,  Thought stopping techniques  Interventions: CBT, Motivational Interviewing, Grounding and Mindfulness Techniques  Summary: Wesley Hudson is a 34 y.o. male who presents with Generalized Anxiety Disorder, Major Depressive Disorder, recurrent, mild.   Suicidal/Homicidal: No -without intent/plan  Therapist Response:  [name] met with clinician for an individual session. Wesley Hudson shared about his psychiatric symptoms, current life events and his homework. [name] shared that Wesley Hudson continues to practice his his techniques and finds them very helpful. Wesley Hudson shared that the baby will soon be here which has stirred up a lot of joy and also increased his anxiety and some depression. Wesley Hudson shared about the fact that processing the grief about his father has highlighted the fact that Wesley Hudson won't be her to share the experience of Anne's daughter. [name] shared that as Wesley Hudson has worked on his grief, Wesley Hudson has softened his stance on his father and as the baby's birth date becomes closer, Wesley Hudson is feeling a real missing and longing to have his father here. Clinician validated his feelings. [name] shared about wanting to feel connected with his father. Clinician introduced a mindfulness technique. Client and clinician practiced the technique together. Once Wesley Hudson felt more centered, [name] had the insight that Wesley Hudson could connect to "his father inside him" and could feel the love Wesley Hudson gave him. [name] and clinician agreed this is not the same as having someone physically here, but that it did help ease the emotional pain. [name] shared that Wesley Hudson was also having catastrophic thoughts that were causing him distress (anxiety and depression). Wesley Hudson shared Wesley Hudson was fearing about his  child's health - though she is healthy and not yet born. Client and clinician discussed how Wesley Hudson felt when Wesley Hudson had those thoughts. Wesley Hudson shared Wesley Hudson felt awful and almost hopeless. Wesley Hudson shared a fear about SIDS. Client and clinician discussed the difference between having a negative thought that then prompts you to healthy action and having one and then acting as if it is true. Client and clinician discussed whether or not Wesley Hudson was informed on healthy sleep positions and baby safety. Wesley Hudson is. Client and clinician discussed his sense of loss and grief when Wesley Hudson stayed in the fear. Client and clinician discussed how to interrupt the thought and how to be mindful of what was presently true - Wesley Hudson has a beautiful wife, a healthy baby on the way, and Wesley Hudson is informed and as prepared as any of [place] can be for the arrival of his child. [name] shared that Wesley Hudson felt much better and hopeful when Wesley Hudson focused on what was true now. [name] agreed to practice his thought stopping techniques and to practice mindfulness and his cbt skills until next session.   Plan: Return again in 3-4 weeks.  Diagnosis:     Axis I: Generalized Anxiety Disorder, Major Depressive Disorder, recurrent, mild    Colm Lyford A, LCSW 10/27/2015

## 2015-11-10 ENCOUNTER — Ambulatory Visit (HOSPITAL_COMMUNITY): Payer: Self-pay | Admitting: Psychiatry

## 2015-11-12 ENCOUNTER — Other Ambulatory Visit (HOSPITAL_COMMUNITY): Payer: Self-pay | Admitting: Psychiatry

## 2015-11-13 ENCOUNTER — Telehealth (HOSPITAL_COMMUNITY): Payer: Self-pay

## 2015-11-13 DIAGNOSIS — F331 Major depressive disorder, recurrent, moderate: Secondary | ICD-10-CM

## 2015-11-13 DIAGNOSIS — F411 Generalized anxiety disorder: Secondary | ICD-10-CM

## 2015-11-13 MED ORDER — VENLAFAXINE HCL ER 75 MG PO CP24: 75.0000 mg | ORAL_CAPSULE | Freq: Every day | ORAL | Status: DC

## 2015-11-13 NOTE — Telephone Encounter (Signed)
Medication refill request - Telephone message left for pt a one time refill of his Effexor XR was approved by Dr. Lolly MustacheArfeen but requested pt. call back to schedule first available as he cancelled 11/10/15, last seen 08/11/15 and nothing currently scheduled.  A new one time refill of patient's Effexor XR e-scribed to patient's [facility] on [address] with instruction evaluation required for further refills.

## 2015-12-07 ENCOUNTER — Ambulatory Visit (HOSPITAL_COMMUNITY): Payer: Self-pay | Admitting: Clinical

## 2015-12-14 ENCOUNTER — Ambulatory Visit (INDEPENDENT_AMBULATORY_CARE_PROVIDER_SITE_OTHER): Payer: BLUE CROSS/BLUE SHIELD | Admitting: Psychiatry

## 2015-12-14 ENCOUNTER — Encounter (HOSPITAL_COMMUNITY): Payer: Self-pay | Admitting: Psychiatry

## 2015-12-14 VITALS — BP 114/74 | HR 88 | Ht 72.0 in | Wt 211.0 lb

## 2015-12-14 DIAGNOSIS — F411 Generalized anxiety disorder: Secondary | ICD-10-CM | POA: Diagnosis not present

## 2015-12-14 DIAGNOSIS — F331 Major depressive disorder, recurrent, moderate: Secondary | ICD-10-CM

## 2015-12-14 DIAGNOSIS — F1010 Alcohol abuse, uncomplicated: Secondary | ICD-10-CM

## 2015-12-14 MED ORDER — VENLAFAXINE HCL ER 75 MG PO CP24: 75.0000 mg | ORAL_CAPSULE | Freq: Every day | ORAL | Status: DC

## 2015-12-14 NOTE — Progress Notes (Signed)
[facility] [id number] progress Note  Wesley Hudson [id number] 34 y.o.   12/14/2015 4:34 PM  Chief Complaint:  Medication management and follow-up.   History of Present Illness:  Wesley Hudson came for his follow-up appointment.  He is taking his Effexor and denies any side effects.  He is very happy and excited because he has 49 weeks old baby who born just before Christmas.  He has cut down his drinking and smoking marijuana.  He is seeing Wesley Hudson for counseling.  He denies any major panic attack or any depressive symptoms.  Sometime he gets anxious but denies any insomnia, irritability, anger, mood swing or any feeling of hopelessness or worthlessness.  He is having his job much better.  Recently he seen his primary care physician and he has blood work .  His energy level is good.  He denies any paranoia or any hallucination.  He wants to continue Effexor.  His appetite is okay.  His vitals are stable.  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  Suicidal Ideation: No Plan Formed: No Patient has means to carry out plan: No  Homicidal Ideation: No Plan Formed: No Patient has means to carry out plan: No  Past Psychiatric History/Hospitalization(s): Patient has seen psychiatrist and therapist since age 56 when he was arrested for drug possession charges.   He had history of DUI at age 6.[name]  He was seeing Dr. Dub Hudson while he was in the school and prescribed Adderall.   He was given Zoloft by his primary care physician but he stopped after 2 months.  Patient endorse history of anxiety, nervousness and depression but denies any history of mania, psychosis, hallucination, aggression or violence.  He has history of using drugs including marijuana and heavy history of alcohol. Anxiety: Yes Bipolar Disorder: No Depression: Yes Mania: No Psychosis: No Schizophrenia: No Personality Disorder: No Hospitalization for psychiatric illness: No History of Electroconvulsive  Shock Therapy: No Prior Suicide Attempts: No  Medical History; Patient has no active medical problem.  His primary care physician is Dr. Clarene Hudson.  He denies any history of seizures.  Review of Systems  Constitutional: Negative.   HENT: Negative.   Skin: Negative.   Neurological: Negative for tremors.  Psychiatric/Behavioral: Negative for depression, suicidal ideas and hallucinations. The patient does not have insomnia.     Psychiatric: Agitation: No Hallucination: No Depressed Mood: No Insomnia: No Hypersomnia: No Altered Concentration: No Feels Worthless: No Grandiose Ideas: No Belief In Special Powers: No New/Increased Substance Abuse: No Compulsions: No  Neurologic: Headache: No Seizure: No Paresthesias: No   Musculoskeletal: Strength & Muscle Tone: within normal limits Gait & Station: normal Patient leans: N/A   Outpatient Encounter Prescriptions as of 12/14/2015  Medication Sig  . venlafaxine XR (EFFEXOR-XR) 75 MG 24 hr capsule Take 1 capsule (75 mg total) by mouth daily.  . [DISCONTINUED] venlafaxine XR (EFFEXOR-XR) 75 MG 24 hr capsule Take 1 capsule (75 mg total) by mouth daily.   No facility-administered encounter medications on file as of 12/14/2015.    No results found for this or any previous visit (from the past 2160 hour(s)).    Constitutional:  BP 114/74 mmHg  Pulse 88  Ht 6' (1.829 m)  Wt 211 lb (95.709 kg)  BMI 28.61 kg/m2   Mental Status Examination;  Patient is well groomed well dressed man who appears to be in his stated age.  He described his mood pleasant and his affect is appropriate.  He  maintained good eye contact. His speech is clear, coherent, fluent with normal tone and volume.  He denies any auditory or visual hallucination.  He denies any active or passive suicidal thoughts or homicidal thought.  There were no delusions, paranoia or any obsessive thoughts.  Psychomotor activity is normal. His fund of knowledge is adequate.  His  thought process logical and goal-directed.  There were no flight of ideas or any loose association.  He has no EPS, tremors or shakes.  His cognition is good.  He is alert and oriented 3.  His insight judgment and impulse control is okay.     Established Problem, Stable/Improving (1), Review or order clinical lab tests (1), Review of Last Therapy Session (1) and Review of Medication Regimen & Side Effects (2)  Assessment: Axis I:  Major depressive disorder, recurrent moderate.  Anxiety disorder.  Alcohol abuse, cannabis abuse  Axis II: Deferred  Axis III:  Past Medical History  Diagnosis Date  . Anxiety   . Depression      Plan:  Patient is a stable on his current medication.  He has significantly cut down his drinking and smoking marijuana.  He wants to stop using in the future since he felt he needed as he is a father of newborn.  Discussed medication side effects and benefits.  [facility]  Patient has no issues or any side effects with Effexor.  I will continue Effexor 75 mg daily.  Recommended to call us back if he has any question or any concern.  Follow-up in 3 months.  Wesley Hudson T., MD 12/14/2015

## 2015-12-28 ENCOUNTER — Ambulatory Visit (INDEPENDENT_AMBULATORY_CARE_PROVIDER_SITE_OTHER): Payer: BLUE CROSS/BLUE SHIELD | Admitting: Clinical

## 2015-12-28 ENCOUNTER — Encounter (HOSPITAL_COMMUNITY): Payer: Self-pay | Admitting: Clinical

## 2015-12-28 DIAGNOSIS — F331 Major depressive disorder, recurrent, moderate: Secondary | ICD-10-CM

## 2015-12-28 DIAGNOSIS — F411 Generalized anxiety disorder: Secondary | ICD-10-CM | POA: Diagnosis not present

## 2015-12-28 NOTE — Progress Notes (Signed)
   THERAPIST PROGRESS NOTE  Session Time: 7:08 -8:05  Participation Level: Active  Behavioral Response: CasualAlertDepressed  Type of Therapy: Individual Therapy  Treatment Goals addressed: improve psychiatric symptoms. emotional regulations skills, Improve unhelpful thought patterns,  thought stopping techniques  Interventions: CBT, Motivational Interviewing, Grounding and Mindfulness Techniques  Summary: Kazden Largo is a 35 y.o. male who presents with Generalized Anxiety Disorder, Major Depressive Disorder, recurrent, mild.   Suicidal/Homicidal: No -without intent/plan  Therapist Response:  [name] met with clinician for an individual session. He shared about his psychiatric symptoms and current life events.[name] shared that his daughter was born, Alden Benjamin, and she is healthy. Alarik shared some about having a new daughter.  He shared that he has been having a lot of catastrophic thoughts. His fears stem from Trumps elections and current actions, the fragility of life, the "other shoe dropping"  and being successful. Client and clinician discussed the emotions he was feeling and the negative thoughts he is experiencing. Client and clinician used cbt to examine his negative automatic thoughts, the evidence for and against them and alternative healthier thoughts. Client and clinician discussed catastrophic thoughts and challenging them.Client and clinician discussed happiness and how our thoughts affect our happiness. Client and clinician discussed the nature of "should" and how it can get in the way of acceptance and actual enjoyment. Client and clinician discussed ways to interrupt the negative thoughts. Client and clinician discussed mindfulness techniques and the act of being present. Clinician introduced the idea of keeping a gratitude journal. Clinician explained the process, purpose and practice of keeping a journal. Zayven stated that he would enjoy that process and would give it a try.  Clinician suggested that Xayvion come back in two weeks.   Plan: Return again in 2-4 weeks.  Diagnosis:     Axis I: Generalized Anxiety Disorder, Major Depressive Disorder, recurrent, mild   Jong Rickman A, LCSW 12/28/2015

## 2016-01-19 ENCOUNTER — Ambulatory Visit (HOSPITAL_COMMUNITY): Payer: Self-pay | Admitting: Clinical

## 2016-01-22 ENCOUNTER — Ambulatory Visit (INDEPENDENT_AMBULATORY_CARE_PROVIDER_SITE_OTHER): Payer: BLUE CROSS/BLUE SHIELD | Admitting: Internal Medicine

## 2016-01-22 DIAGNOSIS — Z9189 Other specified personal risk factors, not elsewhere classified: Secondary | ICD-10-CM

## 2016-01-22 DIAGNOSIS — Z23 Encounter for immunization: Secondary | ICD-10-CM

## 2016-01-22 MED ORDER — CIPROFLOXACIN HCL 500 MG PO TABS: 500.0000 mg | ORAL_TABLET | Freq: Two times a day (BID) | ORAL | Status: DC

## 2016-01-22 MED ORDER — ATOVAQUONE-PROGUANIL HCL 250-100 MG PO TABS: 1.0000 | ORAL_TABLET | Freq: Every day | ORAL | Status: DC

## 2016-01-22 MED ORDER — TYPHOID VACCINE PO CPDR: 1.0000 | DELAYED_RELEASE_CAPSULE | ORAL | Status: DC

## 2016-01-22 NOTE — Progress Notes (Signed)
  RFV: pre-travel counseling for [place] Subjective:    Patient ID: Wesley Hudson, male    DOB: June 13, 1981, 35 y.o.   MRN: [id number]  HPI Wesley Hudson is a 35yo M who works at [employer] will be going to [place] and [place] from march 14-24th.  He has had previous travel vaccines including YF still valid thru June 2017   Review of Systems     Objective:   Physical Exam        Assessment & Plan:  Pre travel vaccination = gave hep A #2, oral vivotif  Malaria prophylaxis = gave malarone and precautions  Traveler's diarrhea = gave precautions and rx for cipro

## 2016-02-11 ENCOUNTER — Ambulatory Visit (HOSPITAL_COMMUNITY): Payer: Self-pay | Admitting: Clinical

## 2016-03-07 ENCOUNTER — Ambulatory Visit (HOSPITAL_COMMUNITY): Payer: Self-pay | Admitting: Clinical

## 2016-03-15 ENCOUNTER — Encounter (HOSPITAL_COMMUNITY): Payer: Self-pay | Admitting: Psychiatry

## 2016-03-15 ENCOUNTER — Ambulatory Visit (INDEPENDENT_AMBULATORY_CARE_PROVIDER_SITE_OTHER): Payer: BLUE CROSS/BLUE SHIELD | Admitting: Psychiatry

## 2016-03-15 VITALS — BP 122/76 | HR 64 | Ht 72.0 in | Wt 214.8 lb

## 2016-03-15 DIAGNOSIS — F411 Generalized anxiety disorder: Secondary | ICD-10-CM

## 2016-03-15 DIAGNOSIS — F331 Major depressive disorder, recurrent, moderate: Secondary | ICD-10-CM

## 2016-03-15 MED ORDER — VENLAFAXINE HCL ER 75 MG PO CP24: 75.0000 mg | ORAL_CAPSULE | Freq: Every day | ORAL | Status: DC

## 2016-03-15 NOTE — Progress Notes (Signed)
[facility] [id number] progress Note  Wesley KobusDaniel Hudson [id number] 34 y.o.   03/15/2016 4:26 PM  Chief Complaint:  Medication management and follow-up.   History of Present Illness:  Wesley Hudson came for his follow-up appointment.  He is doing very well on Effexor.  He denies any major panic attack or anxiety attack.  His depression is under control.  He significantly cut down his drinking .  His daughter is now 2773-month-old.  He is spending more time at home.  His job is going very well.  He denies any irritability, anger, paranoia, hallucination or any crying spells.  His sleep is good.  His energy level is good.  His appetite is okay.  He denies any paranoia, hallucination or any self abusive behavior.  His vitals are okay.  He denies any side effects of medication.  He has no tremors shakes or any EPS.  He is very busy at his work.  He likes his job. 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  He admitted not seeing [name] on a regular basis but promised to reschedule an appointment very soon.  Suicidal Ideation: No Plan Formed: No Patient has means to carry out plan: No  Homicidal Ideation: No Plan Formed: No Patient has means to carry out plan: No  Past Psychiatric History/Hospitalization(s): Patient has seen psychiatrist and therapist since age 35 when he was arrested for drug possession charges.   He had history of DUI at age 35.[name].  He was seeing [name] while he was in the school and prescribed Adderall.   He was given Zoloft by his primary care physician but he stopped after 2 months.  Patient endorse history of anxiety, nervousness and depression but denies any history of mania, psychosis, hallucination, aggression or violence.  He has history of using drugs including marijuana and heavy history of alcohol. Anxiety: Yes Bipolar Disorder: No Depression: Yes Mania: No Psychosis: No Schizophrenia: No Personality Disorder: No Hospitalization for  psychiatric illness: No History of Electroconvulsive Shock Therapy: No Prior Suicide Attempts: No  Medical History; Patient has no active medical problem.  His primary care physician is [name].  He denies any history of seizures.  Review of Systems  Constitutional: Negative.   HENT: Negative.   Skin: Negative.   Neurological: Negative for tremors.  Psychiatric/Behavioral: Negative for depression, suicidal ideas and hallucinations. The patient does not have insomnia.     Psychiatric: Agitation: No Hallucination: No Depressed Mood: No Insomnia: No Hypersomnia: No Altered Concentration: No Feels Worthless: No Grandiose Ideas: No Belief In Special Powers: No New/Increased Substance Abuse: No Compulsions: No  Neurologic: Headache: No Seizure: No Paresthesias: No   Musculoskeletal: Strength & Muscle Tone: within normal limits Gait & Station: normal Patient leans: N/A   Outpatient Encounter Prescriptions as of 03/15/2016  Medication Sig  . atovaquone-proguanil (MALARONE) 250-100 MG TABS tablet Take 1 tablet by mouth daily. Start taking on 3/12, take daily on full stomach until complete  . typhoid (VIVOTIF) DR capsule Take 1 capsule by mouth every other day. Keep refrigerated. Take with room temp. water  . venlafaxine XR (EFFEXOR-XR) 75 MG 24 hr capsule Take 1 capsule (75 mg total) by mouth daily.  . [DISCONTINUED] ciprofloxacin (CIPRO) 500 MG tablet Take 1 tablet (500 mg total) by mouth 2 (two) times daily. If you have 3+ loose stools in 24hr. Can stop taking if diarrhea resolves  . [DISCONTINUED] venlafaxine XR (EFFEXOR-XR) 75 MG 24 hr capsule Take 1 capsule (  75 mg total) by mouth daily.   No facility-administered encounter medications on file as of 03/15/2016.    No results found for this or any previous visit (from the past 2160 hour(s)).    Constitutional:  BP 122/76 mmHg  Pulse 64  Ht 6' (1.829 m)  Wt 214 lb 12.8 oz (97.433 kg)  BMI 29.13 kg/m2   Mental  Status Examination;  Patient is well groomed well dressed man who appears to be in his stated age.  He described his mood pleasant and his affect is appropriate.  He maintained good eye contact. His speech is clear, coherent, fluent with normal tone and volume.  He denies any auditory or visual hallucination.  He denies any active or passive suicidal thoughts or homicidal thought.  There were no delusions, paranoia or any obsessive thoughts.  Psychomotor activity is normal. His fund of knowledge is adequate.  His thought process logical and goal-directed.  There were no flight of ideas or any loose association.  He has no EPS, tremors or shakes.  His cognition is good.  He is alert and oriented 3.  His insight judgment and impulse control is okay.     Established Problem, Stable/Improving (1), Review or order clinical lab tests (1), Review of Last Therapy Session (1) and Review of Medication Regimen & Side Effects (2)  Assessment: Axis I:  Major depressive disorder, recurrent moderate.  Anxiety disorder.  Alcohol abuse, cannabis abuse  Axis II: Deferred  Axis III:  Past Medical History  Diagnosis Date  . Anxiety   . Depression      Plan:  Patient is a stable on his current medication.  He denies any side effects.  He has significantly cut down his drinking and denies any smoking marijuana.  Discussed medication side effects and benefits.  Encouraged to keep appointment with [facility] for [facility].  I will continue Effexor 75 mg daily.  Recommended to call us back if he has any question or any concern.  Follow-up in 3 months.  Agastya Meister T., MD 03/15/2016

## 2016-03-29 ENCOUNTER — Ambulatory Visit (INDEPENDENT_AMBULATORY_CARE_PROVIDER_SITE_OTHER): Payer: BLUE CROSS/BLUE SHIELD | Admitting: Clinical

## 2016-03-29 ENCOUNTER — Encounter (HOSPITAL_COMMUNITY): Payer: Self-pay | Admitting: Clinical

## 2016-03-29 DIAGNOSIS — F411 Generalized anxiety disorder: Secondary | ICD-10-CM | POA: Diagnosis not present

## 2016-03-29 DIAGNOSIS — F4321 Adjustment disorder with depressed mood: Secondary | ICD-10-CM | POA: Diagnosis not present

## 2016-03-29 DIAGNOSIS — F331 Major depressive disorder, recurrent, moderate: Secondary | ICD-10-CM | POA: Diagnosis not present

## 2016-03-30 NOTE — Progress Notes (Signed)
   THERAPIST PROGRESS NOTE  Session Time: 8:08 - 9:05  Participation Level: Active  Behavioral Response: CasualAlertAnxious  Type of Therapy: Individual Therapy  Treatment Goals addressed: improve psychiatric symptoms. emotional regulations skills (stress management),  Interventions: CBT, Motivational Interviewing, Grounding and Mindfulness Techniques  Summary: Wesley Hudson is a 35 y.o. male who presents with Generalized Anxiety Disorder, Major Depressive Disorder, recurrent, mild.   Suicidal/Homicidal: No -without intent/plan  Therapist Response:  Wesley Hudson met with clinician for an individual session. He shared about his psychiatric symptoms and his current life events.  Wesley Hudson and clinician reviewed and updated his assessment. He shared that he has been experiencing anxiety, depression and grief. Wesley Hudson stated that he was enjoying his daughter. He shared that him and his family (wife and infant child) have moved to a new home. He shared that he was experiencing a lot of grief about his father (Dead SINCE 2009) and this was increased due to the birth of his child and obligations to assist with issues with his Mother's house (his father's house).  Client and clinician discussed some stress reduction techniques (grounding and mindfulness techniques). Wesley Hudson and clinician agreed to address his depression, anxiety and grief in more detail in future sessions   Plan: Return again in 3-4 weeks.  Diagnosis:     Axis I: Generalized Anxiety Disorder, Major Depressive Disorder, recurrent, mild   Cutter Passey A, LCSW 03/30/2016
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
## 2016-04-28 ENCOUNTER — Ambulatory Visit (HOSPITAL_COMMUNITY): Payer: Self-pay | Admitting: Clinical

## 2016-06-09 ENCOUNTER — Ambulatory Visit (HOSPITAL_COMMUNITY): Payer: Self-pay | Admitting: Clinical

## 2016-06-16 ENCOUNTER — Ambulatory Visit (HOSPITAL_COMMUNITY): Payer: Self-pay | Admitting: Psychiatry

## 2016-08-11 ENCOUNTER — Ambulatory Visit (HOSPITAL_COMMUNITY): Payer: Self-pay | Admitting: Psychiatry

## 2016-09-07 DIAGNOSIS — Z23 Encounter for immunization: Secondary | ICD-10-CM | POA: Diagnosis not present

## 2016-10-07 ENCOUNTER — Other Ambulatory Visit (HOSPITAL_COMMUNITY): Payer: Self-pay

## 2016-10-07 DIAGNOSIS — F411 Generalized anxiety disorder: Secondary | ICD-10-CM

## 2016-10-07 DIAGNOSIS — F331 Major depressive disorder, recurrent, moderate: Secondary | ICD-10-CM

## 2016-10-07 MED ORDER — VENLAFAXINE HCL ER 75 MG PO CP24: 75.0000 mg | ORAL_CAPSULE | Freq: Every day | ORAL | 0 refills | Status: DC

## 2016-10-25 ENCOUNTER — Ambulatory Visit (INDEPENDENT_AMBULATORY_CARE_PROVIDER_SITE_OTHER): Payer: BLUE CROSS/BLUE SHIELD | Admitting: Psychiatry

## 2016-10-25 ENCOUNTER — Encounter (HOSPITAL_COMMUNITY): Payer: Self-pay | Admitting: Psychiatry

## 2016-10-25 DIAGNOSIS — Z79899 Other long term (current) drug therapy: Secondary | ICD-10-CM | POA: Diagnosis not present

## 2016-10-25 DIAGNOSIS — F331 Major depressive disorder, recurrent, moderate: Secondary | ICD-10-CM

## 2016-10-25 DIAGNOSIS — F411 Generalized anxiety disorder: Secondary | ICD-10-CM

## 2016-10-25 MED ORDER — VENLAFAXINE HCL ER 75 MG PO CP24: 75.0000 mg | ORAL_CAPSULE | Freq: Every day | ORAL | 2 refills | Status: DC

## 2016-10-25 NOTE — Progress Notes (Signed)
Wesley Hudson [id number] progress Note  Wesley KobusDaniel Hudson [id number] 35 y.o.   10/25/2016 4:36 PM  Chief Complaint:  I'm doing good on medication.     History of Present Illness:  Wesley BoomDaniel came for his follow-up appointment.  He is taking the Effexor as prescribed.  He reported no side effects.  He had a good Thanksgiving.  His daughter is now almost 1 year and that was her first Thanksgiving.  Patient mentioned his job is going very well.  He denies any major panic attack or anxiety attack.  His energy level is good.  He denies any feeling of hopelessness or worthlessness.  He is not involved in any self abusive behavior.  He admitted drinking socially sometimes but denies any binge, intoxication or any blackouts.  He likes his job.  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  Patient denies any feeling of hopelessness or worthlessness.  He does not feel he need to see [name] for counseling since he is doing very well.  Patient denies any illegal substance use.  His appetite is okay.  His vital signs are stable.  Suicidal Ideation: No Plan Formed: No Patient has means to carry out plan: No  Homicidal Ideation: No Plan Formed: No Patient has means to carry out plan: No  Past Psychiatric History/Hospitalization(s): Patient has seen psychiatrist and therapist since age 35 when he was arrested for drug possession charges.   He had history of DUI at age 35.Wesley Hudson.  He was seeing [name] while he was in the school and prescribed Adderall.   He was given Zoloft by his primary care physician but he stopped after 2 months.  Patient endorse history of anxiety, nervousness and depression but denies any history of mania, psychosis, hallucination, aggression or violence.  He has history of using drugs including marijuana and heavy history of alcohol. Anxiety: Yes Bipolar Disorder: No Depression: Yes Mania: No Psychosis: No Schizophrenia: No Personality Disorder:  No Hospitalization for psychiatric illness: No History of Electroconvulsive Shock Therapy: No Prior Suicide Attempts: No  Medical History; Patient has no active medical problem.  His primary care physician is Wesley Hudson.  He denies any history of seizures.  Review of Systems  Constitutional: Negative.   HENT: Negative.   Respiratory: Negative.   Musculoskeletal: Negative.   Skin: Negative.   Neurological: Negative.   Psychiatric/Behavioral: The patient does not have insomnia.     Psychiatric: Agitation: No Hallucination: No Depressed Mood: No Insomnia: No Hypersomnia: No Altered Concentration: No Feels Worthless: No Grandiose Ideas: No Belief In Special Powers: No New/Increased Substance Abuse: No Compulsions: No  Neurologic: Headache: No Seizure: No Paresthesias: No   Musculoskeletal: Strength & Muscle Tone: within normal limits Gait & Station: normal Patient leans: N/A   Outpatient Encounter Prescriptions as of 10/25/2016  Medication Sig  . atovaquone-proguanil (MALARONE) 250-100 MG TABS tablet Take 1 tablet by mouth daily. Start taking on 3/12, take daily on full stomach until complete  . typhoid (VIVOTIF) DR capsule Take 1 capsule by mouth every other day. Keep refrigerated. Take with room temp. water  . venlafaxine XR (EFFEXOR-XR) 75 MG 24 hr capsule Take 1 capsule (75 mg total) by mouth daily.   No facility-administered encounter medications on file as of 10/25/2016.     No results found for this or any previous visit (from the past 2160 hour(s)).    Constitutional:  BP 124/76 (BP Location: Left Arm, Patient Position: Sitting, Cuff Size: Normal)  Pulse 64   Ht 6' (1.829 m)   Wt 209 lb 3.2 oz (94.9 kg)   BMI 28.37 kg/m    Mental Status Examination;  Patient is well dressed and well groomed man who appears to be in his stated age.  He is calm cooperative and maintained good eye contact.  He described his mood good and his affect is appropriate.   His speech is clear, coherent with normal tone and volume.  Denies any active or passive suicidal thoughts or homicidal thought.  He denies any auditory or visual hallucination.  There were no paranoia, delusion or any obsessive thoughts.  His cognition is good.  He is alert and oriented 3.  His fund of knowledge is good.  He has no tremors, shakes or any EPS.  His thought processes logical and goal-directed.  His insight judgment and impulse control is okay.  Established Problem, Stable/Improving (1), Review of Last Therapy Session (1) and Review of Medication Regimen & Side Effects (2)  Assessment: Axis I:  Major depressive disorder, recurrent moderate.  Anxiety disorder.  Alcohol abuse, cannabis abuse in remission  Axis II: Deferred  Axis III:  Past Medical History:  Diagnosis Date  . Anxiety   . Depression      Plan:  Patient is doing better on current dose of Effexor.  He has stopped marijuana but is still drinks on occasion but denies any binge or any intoxication.  Discussed medication side effects and benefits.  Continue Effexor 75 mg daily .  Recommended to call Wesley Hudson back if he has any question or a concern.  Follow-up in 3 months.  [name], MD 10/25/2016

## 2016-11-15 DIAGNOSIS — Z114 Encounter for screening for human immunodeficiency virus [HIV]: Secondary | ICD-10-CM | POA: Diagnosis not present

## 2016-11-15 DIAGNOSIS — Z0000 Encounter for general adult medical examination without abnormal findings: Secondary | ICD-10-CM | POA: Diagnosis not present

## 2016-11-23 DIAGNOSIS — E782 Mixed hyperlipidemia: Secondary | ICD-10-CM | POA: Diagnosis not present

## 2017-03-14 ENCOUNTER — Other Ambulatory Visit (HOSPITAL_COMMUNITY): Payer: Self-pay

## 2017-03-14 DIAGNOSIS — F411 Generalized anxiety disorder: Secondary | ICD-10-CM

## 2017-03-14 DIAGNOSIS — F331 Major depressive disorder, recurrent, moderate: Secondary | ICD-10-CM

## 2017-03-14 MED ORDER — VENLAFAXINE HCL ER 75 MG PO CP24: 75.0000 mg | ORAL_CAPSULE | Freq: Every day | ORAL | 1 refills | Status: DC

## 2017-04-25 ENCOUNTER — Ambulatory Visit (HOSPITAL_COMMUNITY): Payer: Self-pay | Admitting: Psychiatry

## 2017-05-09 ENCOUNTER — Other Ambulatory Visit (HOSPITAL_COMMUNITY): Payer: Self-pay | Admitting: Psychiatry

## 2017-05-09 DIAGNOSIS — F411 Generalized anxiety disorder: Secondary | ICD-10-CM

## 2017-05-09 DIAGNOSIS — F331 Major depressive disorder, recurrent, moderate: Secondary | ICD-10-CM

## 2017-05-16 NOTE — Telephone Encounter (Signed)
Patient needs to be seen.

## 2017-09-06 ENCOUNTER — Telehealth (HOSPITAL_COMMUNITY): Payer: Self-pay

## 2017-09-06 DIAGNOSIS — Z23 Encounter for immunization: Secondary | ICD-10-CM | POA: Diagnosis not present

## 2017-09-06 NOTE — Telephone Encounter (Signed)
Patient called for a refill on his Venlafaxine. He was last seen in November of 2017. His last refill was on 05/16/2017 for 30 days and one refill. He was told at that time to make a follow up appointment. Patient was upset that I was denying his refill and felt it was our responsibility to call him and schedule. I explained that if we have not physically seen a patient in 6 months or longer, we can not refill medications. He said that he did not feel I was being sympathetic to him, and I told him that I was sorry he felt that way but there was not a lot I could do short of making him an appointment. You happened to have an opening tomorrow at 8 am and patient said he would take that appointment.

## 2017-09-07 ENCOUNTER — Encounter (HOSPITAL_COMMUNITY): Payer: Self-pay | Admitting: Psychiatry

## 2017-09-07 ENCOUNTER — Ambulatory Visit (INDEPENDENT_AMBULATORY_CARE_PROVIDER_SITE_OTHER): Payer: BLUE CROSS/BLUE SHIELD | Admitting: Psychiatry

## 2017-09-07 DIAGNOSIS — F411 Generalized anxiety disorder: Secondary | ICD-10-CM | POA: Diagnosis not present

## 2017-09-07 DIAGNOSIS — F331 Major depressive disorder, recurrent, moderate: Secondary | ICD-10-CM

## 2017-09-07 DIAGNOSIS — Z818 Family history of other mental and behavioral disorders: Secondary | ICD-10-CM | POA: Diagnosis not present

## 2017-09-07 DIAGNOSIS — Z87891 Personal history of nicotine dependence: Secondary | ICD-10-CM

## 2017-09-07 DIAGNOSIS — Z6379 Other stressful life events affecting family and household: Secondary | ICD-10-CM | POA: Diagnosis not present

## 2017-09-07 MED ORDER — VENLAFAXINE HCL ER 75 MG PO CP24: 75.0000 mg | ORAL_CAPSULE | Freq: Every day | ORAL | 0 refills | Status: DC

## 2017-09-07 NOTE — Progress Notes (Signed)
BH MD/PA/NP OP Progress Note  09/07/2017 8:27 AM Wesley Hudson  MRN:  [id number]  Chief Complaint:  I have been very busy at work.  I apologize missing appointment.  HPI: Tin came for his follow-up appointment.  He was last seen in November 2017.  He missed appointment because of his busy schedule at work.  Overall he described his medicine is working very well.  He denies any crying spells, irritability, mood swings or any feeling of hopelessness or worthlessness.  He continues to drink alcohol on social occasions but denies any binge, intoxication or any blackouts.  His job in recent months getting very stressful but he also have more opportunities.  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  He is taking Effexor and reported no side effects.  His energy level is good.  He denies any illegal substance use.  He lives with his wife and 31-year-old daughter.  Recently he is concerned about his mother's health who diagnosed with non-Hodgkin lymphoma.  His mother lives with him.  Patient denies any hallucination, paranoia, self abusive behavior.  His appetite is okay.  His vital signs are stable.  He saw physician at [facility] earlier this year and he was told everything is okay.  Patient like to continue his Effexor.  Visit Diagnosis:    ICD-10-CM   1. Major depressive disorder, recurrent episode, moderate (HCC) F33.1 venlafaxine XR (EFFEXOR-XR) 75 MG 24 hr capsule  2. GAD (generalized anxiety disorder) F41.1 venlafaxine XR (EFFEXOR-XR) 75 MG 24 hr capsule    Past Psychiatric History: Reviewed. Patient has seen psychiatrist and therapist since age 80 when he was arrested for drug charges.   He had history of DUI at age 21.[name]  He was seeing Dr. Dub Mikes while he was in the school and prescribed Adderall.   He was given Zoloft by his primary care physician but he stopped after 2 months.  Patient reported history of anxiety, nervousness and depression but denies  any history of mania, psychosis, hallucination, aggression or violence.  He has history of heavy drinking and smoking marijuana.    Past Medical History:  Past Medical History:  Diagnosis Date  . Anxiety   . Depression     Past Surgical History:  Procedure Laterality Date  . nasal polyps Bilateral    Removed twice when was a child    Family Psychiatric History: Reviewed.  Family History:  Family History  Problem Relation Age of Onset  . Depression Mother   . Depression Father   . Depression Sister     Social History:  Social History   Social History  . Marital status: Married    Spouse name: N/A  . Number of children: N/A  . Years of education: N/A   Social History Main Topics  . Smoking status: Former Smoker    Quit date: 11/22/2002  . Smokeless tobacco: Never Used  . Alcohol use 3.6 oz/week    6 Cans of beer per week  . Drug use: Yes    Frequency: 2.0 times per week    Types: Marijuana  . Sexual activity: Yes    Partners: Female    Birth control/ protection: None   Other Topics Concern  . Not on file   Social History Narrative  . No narrative on file    Allergies: No Known Allergies  Metabolic Disorder Labs: No results found for: HGBA1C, MPG No results found for: PROLACTIN No results found for: CHOL,  TRIG, HDL, CHOLHDL, VLDL, LDLCALC No results found for: TSH  Therapeutic Level Labs: No results found for: LITHIUM No results found for: VALPROATE No components found for:  CBMZ  Current Medications: Current Outpatient Prescriptions  Medication Sig Dispense Refill  . atovaquone-proguanil (MALARONE) 250-100 MG TABS tablet Take 1 tablet by mouth daily. Start taking on 3/12, take daily on full stomach until complete 20 tablet 0  . typhoid (VIVOTIF) DR capsule Take 1 capsule by mouth every other day. Keep refrigerated. Take with room temp. water 4 capsule 0  . venlafaxine XR (EFFEXOR-XR) 75 MG 24 hr capsule TAKE 1 CAPSULE (75 MG TOTAL) BY MOUTH DAILY.  30 capsule 1   No current facility-administered medications for this visit.      Musculoskeletal: Strength & Muscle Tone: within normal limits Gait & Station: normal Patient leans: N/A  Psychiatric Specialty Exam: ROS  Blood pressure 126/74, pulse 61, height 6' (1.829 m), weight 211 lb (95.7 kg).Body mass index is 28.62 kg/m.  General Appearance: Casual and Well Groomed  Eye Contact:  Good  Speech:  Clear and Coherent  Volume:  Normal  Mood:  Euthymic  Affect:  Appropriate  Thought Process:  Goal Directed  Orientation:  Full (Time, Place, and Person)  Thought Content: Logical   Suicidal Thoughts:  No  Homicidal Thoughts:  No  Memory:  Immediate;   Good Recent;   Good Remote;   Good  Judgement:  Good  Insight:  Good  Psychomotor Activity:  Normal  Concentration:  Concentration: Good and Attention Span: Good  Recall:  Good  Fund of Knowledge: Good  Language: Good  Akathisia:  No  Handed:  Right  AIMS (if indicated): not done  Assets:  Communication Skills Desire for Improvement Housing Resilience Social Support Talents/Skills Transportation  ADL's:  Intact  Cognition: WNL  Sleep:  Good   Screenings:   Assessment and Plan: Major depressive disorder, recurrent.  Generalized anxiety disorder.  Patient doing better on Effexor.  Reinforce medication and follow-up compliance.  He stopped smoking marijuana but is still drinks on occasion but denies any binge or any intoxication.  Continue Effexor 75 mg daily.  We will try to get his blood work results which was done earlier this year at [facility].  Recommended to call us back if he has any question or any concern.  Follow-up in 3 months.   Korra Christine T., MD 09/07/2017, 8:27 AM

## 2017-09-13 DIAGNOSIS — H5213 Myopia, bilateral: Secondary | ICD-10-CM | POA: Diagnosis not present

## 2017-09-13 DIAGNOSIS — H52223 Regular astigmatism, bilateral: Secondary | ICD-10-CM | POA: Diagnosis not present

## 2017-12-11 ENCOUNTER — Ambulatory Visit (HOSPITAL_COMMUNITY): Payer: Self-pay | Admitting: Psychiatry

## 2017-12-13 ENCOUNTER — Telehealth (HOSPITAL_COMMUNITY): Payer: Self-pay

## 2017-12-13 NOTE — Telephone Encounter (Signed)
Can provide 30-day supply until his next appointment.

## 2017-12-13 NOTE — Telephone Encounter (Signed)
Medication refill request - Fax received from pt's [facility] [address] for a refill of his prescribed Venlafaxine XR, last prescribed for 90 days 09/07/17. Pt. cancelled appt 12/11/17 and rescheduled for 12/29/17.

## 2017-12-14 ENCOUNTER — Other Ambulatory Visit (HOSPITAL_COMMUNITY): Payer: Self-pay

## 2017-12-14 DIAGNOSIS — F331 Major depressive disorder, recurrent, moderate: Secondary | ICD-10-CM

## 2017-12-14 DIAGNOSIS — F411 Generalized anxiety disorder: Secondary | ICD-10-CM

## 2017-12-14 MED ORDER — VENLAFAXINE HCL ER 75 MG PO CP24: 75.0000 mg | ORAL_CAPSULE | Freq: Every day | ORAL | 0 refills | Status: DC

## 2017-12-29 ENCOUNTER — Ambulatory Visit (HOSPITAL_COMMUNITY): Payer: Self-pay | Admitting: Psychiatry

## 2018-01-06 ENCOUNTER — Encounter (HOSPITAL_COMMUNITY): Payer: Self-pay | Admitting: Psychiatry

## 2018-01-06 ENCOUNTER — Ambulatory Visit (HOSPITAL_COMMUNITY): Payer: BLUE CROSS/BLUE SHIELD | Admitting: Psychiatry

## 2018-01-06 DIAGNOSIS — F1210 Cannabis abuse, uncomplicated: Secondary | ICD-10-CM

## 2018-01-06 DIAGNOSIS — Z87891 Personal history of nicotine dependence: Secondary | ICD-10-CM

## 2018-01-06 DIAGNOSIS — F411 Generalized anxiety disorder: Secondary | ICD-10-CM | POA: Diagnosis not present

## 2018-01-06 DIAGNOSIS — Z818 Family history of other mental and behavioral disorders: Secondary | ICD-10-CM

## 2018-01-06 DIAGNOSIS — F331 Major depressive disorder, recurrent, moderate: Secondary | ICD-10-CM | POA: Diagnosis not present

## 2018-01-06 DIAGNOSIS — F1099 Alcohol use, unspecified with unspecified alcohol-induced disorder: Secondary | ICD-10-CM

## 2018-01-06 MED ORDER — VENLAFAXINE HCL ER 75 MG PO CP24: 75.0000 mg | ORAL_CAPSULE | Freq: Every day | ORAL | 0 refills | Status: DC

## 2018-01-06 NOTE — Progress Notes (Signed)
BH MD/PA/NP OP Progress Note  01/06/2018 10:11 AM Wesley Hudson  MRN:  [id number]  Chief Complaint: I am doing fine.  I have been very busy at work.  I had a good Christmas.  HPI: Patient came for his follow-up appointment.  He apologized missing his last appointment.  However he is taking Effexor as prescribed.  He has been very busy at work.  He went to [place] to visit his family from his father's side and he had a good time.  He spent [employer] and Christmas there.  2 weeks ago his grand father died in [place] and he was sad and anxious about it.  However he has been sleeping good.  He denies any feeling of hopelessness or worthlessness.  He denies any irritability, crying spells or any suicidal thoughts.  Though his job is very busy but he is handling very well.  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  Patient lives with his wife and his 6142-month-old daughter.  He admitted occasional smoking marijuana but overall he gradually cut down from the past.  Patient denies any hallucination, paranoia, suicidal thoughts.  His energy level is good.  He is trying to lose weight and he lost a few pounds from the last visit.  He is also actively looking for a new primary care physician. His last blood work in dec, 2017 which was normal.  Visit Diagnosis:    ICD-10-CM   1. Major depressive disorder, recurrent episode, moderate (HCC) F33.1 venlafaxine XR (EFFEXOR-XR) 75 MG 24 hr capsule  2. GAD (generalized anxiety disorder) F41.1 venlafaxine XR (EFFEXOR-XR) 75 MG 24 hr capsule    Past Psychiatric History: Reviewed. Patient has seen psychiatrist and therapist since age 37 when he was arrested for drug charges. He had history of DUI at age 37.[name]. He was seeing [name] while he was in the school and prescribed Adderall. He was given Zoloft by his primary care physician but he stopped after 2 months. Patient reported history of anxiety, nervousness and depression but denies  any history of mania, psychosis, hallucination, aggression or violence. He has history of heavy drinking and smoking marijuana.    Past Medical History:  Past Medical History:  Diagnosis Date  . Anxiety   . Depression     Past Surgical History:  Procedure Laterality Date  . nasal polyps Bilateral    Removed twice when was a child    Family Psychiatric History: Reviewed.  Family History:  Family History  Problem Relation Age of Onset  . Depression Mother   . Depression Father   . Depression Sister     Social History:  Social History   Socioeconomic History  . Marital status: Married    Spouse name: Not on file  . Number of children: Not on file  . Years of education: Not on file  . Highest education level: Not on file  Social Needs  . Financial resource strain: Not on file  . Food insecurity - worry: Not on file  . Food insecurity - inability: Not on file  . Transportation needs - medical: Not on file  . Transportation needs - non-medical: Not on file  Occupational History  . Not on file  Tobacco Use  . Smoking status: Former Smoker    Last attempt to quit: 11/22/2002    Years since quitting: 15.1  . Smokeless tobacco: Never Used  Substance and Sexual Activity  . Alcohol use: Yes  Alcohol/week: 3.6 oz    Types: 6 Cans of beer per week  . Drug use: Yes    Frequency: 2.0 times per week    Types: Marijuana  . Sexual activity: Yes    Partners: Female    Birth control/protection: None  Other Topics Concern  . Not on file  Social History Narrative  . Not on file    Allergies: No Known Allergies  Metabolic Disorder Labs: No results found for: HGBA1C, MPG No results found for: PROLACTIN No results found for: CHOL, TRIG, HDL, CHOLHDL, VLDL, LDLCALC No results found for: TSH  Therapeutic Level Labs: No results found for: LITHIUM No results found for: VALPROATE No components found for:  CBMZ  Current Medications: Current Outpatient Medications   Medication Sig Dispense Refill  . venlafaxine XR (EFFEXOR-XR) 75 MG 24 hr capsule Take 1 capsule (75 mg total) by mouth daily. 90 capsule 0   No current facility-administered medications for this visit.      Musculoskeletal: Strength & Muscle Tone: within normal limits Gait & Station: normal Patient leans: N/A  Psychiatric Specialty Exam: ROS  Blood pressure 132/76, pulse 64, height 6' (1.829 m), weight 206 lb 9.6 oz (93.7 kg).Body mass index is 28.02 kg/m.  General Appearance: Casual  Eye Contact:  Good  Speech:  Clear and Coherent  Volume:  Normal  Mood:  Euthymic  Affect:  Congruent  Thought Process:  Goal Directed  Orientation:  Full (Time, Place, and Person)  Thought Content: Logical   Suicidal Thoughts:  No  Homicidal Thoughts:  No  Memory:  Immediate;   Good Recent;   Good Remote;   Good  Judgement:  Good  Insight:  Good  Psychomotor Activity:  Normal  Concentration:  Concentration: Good and Attention Span: Good  Recall:  Good  Fund of Knowledge: Good  Language: Good  Akathisia:  No  Handed:  Right  AIMS (if indicated): not done  Assets:  Communication Skills Desire for Improvement Financial Resources/Insurance Housing Resilience Social Support  ADL's:  Intact  Cognition: WNL  Sleep:  Good   Screenings:   Assessment and Plan: Generalized anxiety disorder.  Major depressive disorder, recurrent.  Cannabis abuse.  Patient is a stable on his Effexor 75 mg daily.  He has cut down his cannabis and he is trying to stopped completely.  He is not interested in counseling.  Discussed medication side effects and benefits.  Reviewed blood work results which was done in December 2017.  Patient is scheduled to have another blood work and he is actively looking for a new primary care physician.  Discussed medication side effects and benefits.  Recommended to call us back if is any question or any concern.  Follow-up in 3 months.   Cleotis Nipper, MD 01/06/2018, 10:11  AM

## 2018-02-07 DIAGNOSIS — E7800 Pure hypercholesterolemia, unspecified: Secondary | ICD-10-CM | POA: Diagnosis not present

## 2018-02-07 DIAGNOSIS — Z0000 Encounter for general adult medical examination without abnormal findings: Secondary | ICD-10-CM | POA: Diagnosis not present

## 2018-02-07 DIAGNOSIS — F419 Anxiety disorder, unspecified: Secondary | ICD-10-CM | POA: Diagnosis not present

## 2018-04-05 ENCOUNTER — Other Ambulatory Visit (HOSPITAL_COMMUNITY): Payer: Self-pay

## 2018-04-05 DIAGNOSIS — F331 Major depressive disorder, recurrent, moderate: Secondary | ICD-10-CM

## 2018-04-05 DIAGNOSIS — F411 Generalized anxiety disorder: Secondary | ICD-10-CM

## 2018-04-05 MED ORDER — VENLAFAXINE HCL ER 75 MG PO CP24: 75.0000 mg | ORAL_CAPSULE | Freq: Every day | ORAL | 0 refills | Status: DC

## 2018-05-07 ENCOUNTER — Other Ambulatory Visit: Payer: Self-pay

## 2018-05-07 ENCOUNTER — Encounter (HOSPITAL_COMMUNITY): Payer: Self-pay | Admitting: Psychiatry

## 2018-05-07 ENCOUNTER — Ambulatory Visit (HOSPITAL_COMMUNITY): Payer: BLUE CROSS/BLUE SHIELD | Admitting: Psychiatry

## 2018-05-07 DIAGNOSIS — F1099 Alcohol use, unspecified with unspecified alcohol-induced disorder: Secondary | ICD-10-CM | POA: Diagnosis not present

## 2018-05-07 DIAGNOSIS — F1210 Cannabis abuse, uncomplicated: Secondary | ICD-10-CM

## 2018-05-07 DIAGNOSIS — F331 Major depressive disorder, recurrent, moderate: Secondary | ICD-10-CM

## 2018-05-07 DIAGNOSIS — F411 Generalized anxiety disorder: Secondary | ICD-10-CM

## 2018-05-07 DIAGNOSIS — Z87891 Personal history of nicotine dependence: Secondary | ICD-10-CM | POA: Diagnosis not present

## 2018-05-07 DIAGNOSIS — Z653 Problems related to other legal circumstances: Secondary | ICD-10-CM | POA: Diagnosis not present

## 2018-05-07 DIAGNOSIS — Z818 Family history of other mental and behavioral disorders: Secondary | ICD-10-CM | POA: Diagnosis not present

## 2018-05-07 MED ORDER — VENLAFAXINE HCL ER 75 MG PO CP24: 75.0000 mg | ORAL_CAPSULE | Freq: Every day | ORAL | 0 refills | Status: DC

## 2018-05-07 NOTE — Progress Notes (Signed)
BH MD/PA/NP OP Progress Note  05/07/2018 1:43 PM Wesley Hudson  MRN:  [id number]  Chief Complaint: I am doing better.  Anxiety is well controlled.  HPI: Patient came for her follow-up appointment.  Is compliant with Effexor.  He denies any major panic attack.  His energy level is good.  His job is going well.  Recently he is on summer vacation and admitted there are days when he is sleep more than usual.  He is hoping to visit [place] to visit his family members.  His grandfather died and family is having reunion.  He feels proud that he cut down his smoking marijuana.  He usually smokes once a month mostly on the weekend but denies any intoxication.  He denies any feeling of hopelessness or worthlessness.  He feels proud that he is handing his job very well.  He lives with his wife and 45-1/2-year-old daughter.  His daughter is going to start preschool very soon.  Patient denies any paranoia, hallucination or any agitation.  His energy level is good.  His appetite is okay.  He had blood work 2 months ago at his primary care physician at [facility].  He was told everything is normal.  Visit Diagnosis:    ICD-10-CM   1. Major depressive disorder, recurrent episode, moderate (HCC) F33.1 venlafaxine XR (EFFEXOR-XR) 75 MG 24 hr capsule  2. GAD (generalized anxiety disorder) F41.1 venlafaxine XR (EFFEXOR-XR) 75 MG 24 hr capsule    Past Psychiatric History: Reviewed. Patient has seen psychiatrist and therapist since age 37 when he was arrested for drug charges. He had history of DUI at age 37.[name] He was seeing Dr. Dub Mikes while he was in the school and prescribed Adderall. He was given Zoloft by his primary care physician but he stopped after 2 months. Patientreportedhistory of anxiety, nervousness and depression but denies any history of mania, psychosis, hallucination, aggression or violence. He has history ofheavy drinking and smoking marijuana.  Past Medical History:  Past Medical History:   Diagnosis Date  . Anxiety   . Depression     Past Surgical History:  Procedure Laterality Date  . nasal polyps Bilateral    Removed twice when was a child    Family Psychiatric History: Reviewed.  Family History:  Family History  Problem Relation Age of Onset  . Depression Mother   . Depression Father   . Depression Sister     Social History:  Social History   Socioeconomic History  . Marital status: Married    Spouse name: Not on file  . Number of children: Not on file  . Years of education: Not on file  . Highest education level: Not on file  Occupational History  . Not on file  Social Needs  . Financial resource strain: Not on file  . Food insecurity:    Worry: Not on file    Inability: Not on file  . Transportation needs:    Medical: Not on file    Non-medical: Not on file  Tobacco Use  . Smoking status: Former Smoker    Last attempt to quit: 11/22/2002    Years since quitting: 15.4  . Smokeless tobacco: Never Used  Substance and Sexual Activity  . Alcohol use: Yes    Alcohol/week: 3.6 oz    Types: 6 Cans of beer per week  . Drug use: Yes    Frequency: 2.0 times per week    Types: Marijuana  . Sexual activity: Yes    Partners:  Female    Birth control/protection: None  Lifestyle  . Physical activity:    Days per week: Not on file    Minutes per session: Not on file  . Stress: Not on file  Relationships  . Social connections:    Talks on phone: Not on file    Gets together: Not on file    Attends religious service: Not on file    Active member of club or organization: Not on file    Attends meetings of clubs or organizations: Not on file    Relationship status: Not on file  Other Topics Concern  . Not on file  Social History Narrative  . Not on file    Allergies: No Known Allergies  Metabolic Disorder Labs: No results found for: HGBA1C, MPG No results found for: PROLACTIN No results found for: CHOL, TRIG, HDL, CHOLHDL, VLDL,  LDLCALC No results found for: TSH  Therapeutic Level Labs: No results found for: LITHIUM No results found for: VALPROATE No components found for:  CBMZ  Current Medications: Current Outpatient Medications  Medication Sig Dispense Refill  . venlafaxine XR (EFFEXOR-XR) 75 MG 24 hr capsule Take 1 capsule (75 mg total) by mouth daily. 90 capsule 0   No current facility-administered medications for this visit.      Musculoskeletal: Strength & Muscle Tone: within normal limits Gait & Station: normal Patient leans: N/A  Psychiatric Specialty Exam: ROS  Blood pressure 130/78, pulse (!) 49, height 6' (1.829 m), weight 207 lb 9.6 oz (94.2 kg), SpO2 96 %.Body mass index is 28.16 kg/m.  General Appearance: Casual and wearing short  Eye Contact:  Good  Speech:  Clear and Coherent  Volume:  Normal  Mood:  Euthymic  Affect:  Appropriate  Thought Process:  Goal Directed  Orientation:  Full (Time, Place, and Person)  Thought Content: WDL   Suicidal Thoughts:  No  Homicidal Thoughts:  No  Memory:  Immediate;   Good Recent;   Good Remote;   Good  Judgement:  Good  Insight:  Good  Psychomotor Activity:  Normal  Concentration:  Concentration: Good and Attention Span: Good  Recall:  Good  Fund of Knowledge: Good  Language: Good  Akathisia:  No  Handed:  Right  AIMS (if indicated): not done  Assets:  Communication Skills Desire for Improvement Housing Resilience Social Support Talents/Skills Transportation Vocational/Educational  ADL's:  Intact  Cognition: WNL  Sleep:  Good   Screenings:   Assessment and Plan: Generalized anxiety disorder.  Major depressive disorder, recurrent.  Cannabis abuse.  Patient is doing very well on Effexor 75 mg daily.  He had cut down his cannabis and he only smokes on occasions.  Patient is not interested in counseling.  Discussed medication side effects and benefits.  We will get blood work results [facility].  Patient has no side effects from the medication.  He has no tremors, shakes or any EPS.  Recommended to continue Effexor 75 mg daily.  Follow-up in 3 months.   Cleotis NipperSyed T Arfeen, MD 05/07/2018, 1:43 PM

## 2018-08-09 ENCOUNTER — Ambulatory Visit (HOSPITAL_COMMUNITY): Payer: Self-pay | Admitting: Psychiatry

## 2018-08-28 DIAGNOSIS — Z23 Encounter for immunization: Secondary | ICD-10-CM | POA: Diagnosis not present

## 2018-10-03 ENCOUNTER — Ambulatory Visit (HOSPITAL_COMMUNITY): Payer: Self-pay | Admitting: Psychiatry

## 2018-11-02 ENCOUNTER — Other Ambulatory Visit (HOSPITAL_COMMUNITY): Payer: Self-pay

## 2018-11-02 DIAGNOSIS — F411 Generalized anxiety disorder: Secondary | ICD-10-CM

## 2018-11-02 DIAGNOSIS — F331 Major depressive disorder, recurrent, moderate: Secondary | ICD-10-CM

## 2018-11-02 MED ORDER — VENLAFAXINE HCL ER 75 MG PO CP24: 75.0000 mg | ORAL_CAPSULE | Freq: Every day | ORAL | 0 refills | Status: DC

## 2018-12-28 ENCOUNTER — Encounter (HOSPITAL_COMMUNITY): Payer: Self-pay | Admitting: Psychiatry

## 2018-12-28 ENCOUNTER — Ambulatory Visit (INDEPENDENT_AMBULATORY_CARE_PROVIDER_SITE_OTHER): Payer: BLUE CROSS/BLUE SHIELD | Admitting: Psychiatry

## 2018-12-28 DIAGNOSIS — F411 Generalized anxiety disorder: Secondary | ICD-10-CM | POA: Diagnosis not present

## 2018-12-28 DIAGNOSIS — F331 Major depressive disorder, recurrent, moderate: Secondary | ICD-10-CM

## 2018-12-28 MED ORDER — VENLAFAXINE HCL ER 75 MG PO CP24: 75.0000 mg | ORAL_CAPSULE | Freq: Every day | ORAL | 0 refills | Status: DC

## 2018-12-28 NOTE — Progress Notes (Signed)
BH MD/PA/NP OP Progress Note  12/28/2018 8:40 AM Wesley Hudson  MRN:  [id number]  Chief Complaint:  I am doing good.  I cut down my marijuana and I only smoke on occasions.  Anxiety is under control.  HPI: Patient came for his follow-up appointment.  He is taking Effexor as prescribed.  He describes his anxiety and depression is under control but since season change and daylight savings happen he feel lack of energy and get easily tired.  However he denies any irritability, depression, crying spells, feeling of hopelessness or worthlessness.  He is sleeping good.  His job is going very well.  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  He denies any mania, psychosis or any hallucination.  He denies any panic attack.  He lives with his wife and 43-year-old daughter.  His appetite is okay.  He had a blood work week ago for the insurance reason and he was told everything is normal.  Patient has no side effects from Effexor.  He cut down his marijuana use significantly and last use 4 weeks ago.  Visit Diagnosis:    ICD-10-CM   1. Major depressive disorder, recurrent episode, moderate (HCC) F33.1 venlafaxine XR (EFFEXOR-XR) 75 MG 24 hr capsule  2. GAD (generalized anxiety disorder) F41.1 venlafaxine XR (EFFEXOR-XR) 75 MG 24 hr capsule    Past Psychiatric History: Reviewed. H/O psychiatrist and therapist since age 38 when arrested for drug charges. H/O DUI at age 38. H/O heavy drinking and cannabis use. Saw Dr. Dub Mikes in his school age and prescribed Adderall. H/O depression and anxiety. Tried Zoloft by PCP but stopped after 2 months. No H/O mania, psychosis, hallucination, aggression or violence.   Past Medical History:  Past Medical History:  Diagnosis Date  . Anxiety   . Depression     Past Surgical History:  Procedure Laterality Date  . nasal polyps Bilateral    Removed twice when was a child    Family Psychiatric History: Reviewed.  Family History:  Family  History  Problem Relation Age of Onset  . Depression Mother   . Depression Father   . Depression Sister     Social History:  Social History   Socioeconomic History  . Marital status: Married    Spouse name: Not on file  . Number of children: Not on file  . Years of education: Not on file  . Highest education level: Not on file  Occupational History  . Not on file  Social Needs  . Financial resource strain: Not on file  . Food insecurity:    Worry: Not on file    Inability: Not on file  . Transportation needs:    Medical: Not on file    Non-medical: Not on file  Tobacco Use  . Smoking status: Former Smoker    Last attempt to quit: 11/22/2002    Years since quitting: 16.1  . Smokeless tobacco: Never Used  Substance and Sexual Activity  . Alcohol use: Yes    Alcohol/week: 6.0 standard drinks    Types: 6 Cans of beer per week  . Drug use: Yes    Frequency: 2.0 times per week    Types: Marijuana  . Sexual activity: Yes    Partners: Female    Birth control/protection: None  Lifestyle  . Physical activity:    Days per week: Not on file    Minutes per session: Not on file  . Stress: Not on file  Relationships  . Social connections:    Talks on phone: Not on file    Gets together: Not on file    Attends religious service: Not on file    Active member of club or organization: Not on file    Attends meetings of clubs or organizations: Not on file    Relationship status: Not on file  Other Topics Concern  . Not on file  Social History Narrative  . Not on file    Allergies: No Known Allergies  Metabolic Disorder Labs: No results found for: HGBA1C, MPG No results found for: PROLACTIN No results found for: CHOL, TRIG, HDL, CHOLHDL, VLDL, LDLCALC No results found for: TSH  Therapeutic Level Labs: No results found for: LITHIUM No results found for: VALPROATE No components found for:  CBMZ  Current Medications: Current Outpatient Medications  Medication Sig  Dispense Refill  . venlafaxine XR (EFFEXOR-XR) 75 MG 24 hr capsule Take 1 capsule (75 mg total) by mouth daily. 90 capsule 0   No current facility-administered medications for this visit.      Musculoskeletal: Strength & Muscle Tone: within normal limits Gait & Station: normal Patient leans: N/A  Psychiatric Specialty Exam: ROS  Blood pressure 117/82, pulse 66, height 6' (1.829 m), weight 206 lb (93.4 kg), SpO2 96 %.Body mass index is 27.94 kg/m.  General Appearance: Well Groomed  Eye Contact:  Good  Speech:  Clear and Coherent  Volume:  Normal  Mood:  Euthymic  Affect:  Congruent  Thought Process:  Goal Directed  Orientation:  Full (Time, Place, and Person)  Thought Content: Logical   Suicidal Thoughts:  No  Homicidal Thoughts:  No  Memory:  Immediate;   Good Recent;   Good Remote;   Good  Judgement:  Good  Insight:  Good  Psychomotor Activity:  Normal  Concentration:  Concentration: Good and Attention Span: Good  Recall:  Good  Fund of Knowledge: Good  Language: Good  Akathisia:  No  Handed:  Right  AIMS (if indicated): not done  Assets:  Communication Skills Desire for Improvement Housing Intimacy Physical Health Resilience Social Support Talents/Skills Transportation  ADL's:  Intact  Cognition: WNL  Sleep:  Good   Screenings:   Assessment and Plan: Major depressive disorder, recurrent.  Generalized anxiety disorder.  Cannabis abuse  Patient is a stable on his current medication of Effexor.  He had cut down his cannabis use and only smokes on rare occasions.  He is doing well on Effexor 75 mg daily.  He said he is not interested in counseling.  Continue current dose of Effexor.  He has blood work done in March 2019 which was normal.  Recommended to have his recent blood work results faxed to [place].  Encourage healthy lifestyle and watch his calorie intake.  Recommended to call us back if is any question or any concern.  Follow-up in 4 months.   Cleotis Nipper, MD 12/28/2018, 8:40 AM

## 2019-04-16 ENCOUNTER — Other Ambulatory Visit: Payer: Self-pay

## 2019-04-16 ENCOUNTER — Ambulatory Visit (INDEPENDENT_AMBULATORY_CARE_PROVIDER_SITE_OTHER): Payer: BLUE CROSS/BLUE SHIELD | Admitting: Psychiatry

## 2019-04-16 ENCOUNTER — Encounter (HOSPITAL_COMMUNITY): Payer: Self-pay | Admitting: Psychiatry

## 2019-04-16 DIAGNOSIS — F331 Major depressive disorder, recurrent, moderate: Secondary | ICD-10-CM | POA: Diagnosis not present

## 2019-04-16 DIAGNOSIS — F411 Generalized anxiety disorder: Secondary | ICD-10-CM

## 2019-04-16 MED ORDER — VENLAFAXINE HCL ER 75 MG PO CP24: 75.0000 mg | ORAL_CAPSULE | Freq: Every day | ORAL | 1 refills | Status: DC

## 2019-04-16 NOTE — Progress Notes (Signed)
Virtual Visit via Telephone Note  I connected with Wesley Hudson on 04/16/19 at  8:40 AM EDT by telephone and verified that I am speaking with the correct person using two identifiers.   I discussed the limitations, risks, security and privacy concerns of performing an evaluation and management service by telephone and the availability of in person appointments. I also discussed with the patient that there may be a patient responsible charge related to this service. The patient expressed understanding and agreed to proceed.   History of Present Illness: Patient was evaluated by phone session.  He is doing better on his current medication.  Since COVID-19 he has lost few pounds since eating better and staying home and doing exercise.  He is working from home and his job is stable.  He almost stopped smoking marijuana because staying home.  He feels his depression and anxiety under control.  He denies any crying spells or any feeling of hopelessness or worthlessness.  He denies any panic attack.  He had a blood work 2 months ago for the insurance reason and he was told everything was normal.  Reported no tremors, EPS or any shakes.  Past Psychiatric History: Reviewed. H/O psychiatrist and therapist since age 33 when arrested for drug charges. H/O DUI at age 61. H/O heavy drinking and cannabis use. Saw Dr. Dub Mikes in his school age and prescribed Adderall. H/O depression and anxiety. Tried Zoloft by PCP but stopped after 2 months. No H/O mania, psychosis, hallucination, aggression or violence.     Observations/Objective: Mental status examination done on the phone.  Patient describes his mood good.  His speech is fluent, clear, coherent with normal tone and volume.  His thought process logical and goal-directed.  There were no flight of ideas or loose association.  His attention concentration is good.  He denies any auditory or visual hallucination.  He denies any active or passive suicidal thoughts or  homicidal thought.  There were no delusions, paranoia or any grandiosity.  He is alert and oriented x3.  His fund of knowledge is adequate.  His cognition is good.  He reported no tremors or EPS.  His insight and judgment is good.  Assessment and Plan: Major depressive disorder, recurrent.  Generalized anxiety disorder.  Cannabis use in partial remission.  Patient doing well on his current Effexor.  He has no tremors or shakes.  I will continue Effexor XR 75 mg daily.  Patient told he had a blood work for the insurance reason 2 months ago and he was told everything was normal.  I suggested that he should mail the blood results report to [place].  He told he had stopped smoking marijuana since staying home due to COVID-19.  Like to continue his current medication.  Encourage healthy lifestyle and watch his calorie intake.  Recommended to call us back if is any question or any concern.  Follow-up in 6 months.  Follow Up Instructions:    I discussed the assessment and treatment plan with the patient. The patient was provided an opportunity to ask questions and all were answered. The patient agreed with the plan and demonstrated an understanding of the instructions.   The patient was advised to call back or seek an in-person evaluation if the symptoms worsen or if the condition fails to improve as anticipated.  I provided 15 minutes of non-face-to-face time during this encounter.   Cleotis Nipper, MD

## 2019-07-02 DIAGNOSIS — Z03818 Encounter for observation for suspected exposure to other biological agents ruled out: Secondary | ICD-10-CM | POA: Diagnosis not present

## 2019-07-02 DIAGNOSIS — Z7189 Other specified counseling: Secondary | ICD-10-CM | POA: Diagnosis not present

## 2019-08-17 DIAGNOSIS — Z23 Encounter for immunization: Secondary | ICD-10-CM | POA: Diagnosis not present

## 2019-10-17 ENCOUNTER — Ambulatory Visit (INDEPENDENT_AMBULATORY_CARE_PROVIDER_SITE_OTHER): Payer: BC Managed Care – PPO | Admitting: Psychiatry

## 2019-10-17 ENCOUNTER — Encounter (HOSPITAL_COMMUNITY): Payer: Self-pay | Admitting: Psychiatry

## 2019-10-17 ENCOUNTER — Other Ambulatory Visit: Payer: Self-pay

## 2019-10-17 VITALS — Wt 195.0 lb

## 2019-10-17 DIAGNOSIS — F331 Major depressive disorder, recurrent, moderate: Secondary | ICD-10-CM | POA: Diagnosis not present

## 2019-10-17 DIAGNOSIS — F1210 Cannabis abuse, uncomplicated: Secondary | ICD-10-CM

## 2019-10-17 DIAGNOSIS — F411 Generalized anxiety disorder: Secondary | ICD-10-CM | POA: Diagnosis not present

## 2019-10-17 MED ORDER — VENLAFAXINE HCL ER 75 MG PO CP24: 75.0000 mg | ORAL_CAPSULE | Freq: Every day | ORAL | 1 refills | Status: DC

## 2019-10-17 NOTE — Progress Notes (Signed)
Virtual Visit via Telephone Note  I connected with Wesley Hudson on 10/17/19 at  8:20 AM EST by telephone and verified that I am speaking with the correct person using two identifiers.   I discussed the limitations, risks, security and privacy concerns of performing an evaluation and management service by telephone and the availability of in person appointments. I also discussed with the patient that there may be a patient responsible charge related to this service. The patient expressed understanding and agreed to proceed.   History of Present Illness: Patient was evaluated through phone session.  He is a stable on Effexor.  He reported no side effects.  He is trying to lose weight and he lost 10 pounds in past 6 months.  He is watching his calorie intake and doing exercise.  He has cut down his marijuana use but is still smoke weekly.  He has minor panic attack few weeks ago when he was thinking about the future and pandemic.  However overall his depression anxiety is stable.  Most of the time is working from home.  He denies crying spells or any feeling of hopelessness.  His energy level is good.  Denies any mania, psychosis or any hallucination.  He has no tremors or shakes.  He likes to continue current dose of Effexor.   Past Psychiatric History:Reviewed. H/Oseeing psychiatrist and therapist since age 89 when arrested for drug charges.H/ODUI at age 89.H/Oheavy drinking and cannabis use.SawDr. Heath Lark schoolage andgiven Adderall.H/O depression and anxiety. TriedZoloft byPCPbutstopped after 2 months.No H/Omania, psychosis, hallucination, aggression or violence.    Psychiatric Specialty Exam: Physical Exam  ROS  There were no vitals taken for this visit.There is no height or weight on file to calculate BMI.  General Appearance: NA  Eye Contact:  NA  Speech:  Clear and Coherent and Normal Rate  Volume:  Normal  Mood:  Euthymic  Affect:  NA  Thought Process:  Goal  Directed  Orientation:  Full (Time, Place, and Person)  Thought Content:  WDL  Suicidal Thoughts:  No  Homicidal Thoughts:  No  Memory:  Immediate;   Good Recent;   Good Remote;   Good  Judgement:  Good  Insight:  Present  Psychomotor Activity:  NA  Concentration:  Concentration: Good and Attention Span: Good  Recall:  Good  Fund of Knowledge:  Good  Language:  Good  Akathisia:  No  Handed:  Right  AIMS (if indicated):     Assets:  Communication Skills Desire for Improvement Housing Resilience Social Support Talents/Skills Transportation  ADL's:  Intact  Cognition:  WNL  Sleep:   ok      Assessment and Plan: Major depressive disorder, recurrent.  Generalized anxiety disorder.  Cannabis use mild.  Patient is a stable on his current medication.  He is trying to cut down his cannabis use since he is staying most of the time at home and does not want to smoke in front of family.  I encourage that he should be stopped cannabis completely.  Discussed medication side effects and benefits.  Encourage healthy lifestyle and watching his calorie intake.  He is able to lost 10 pounds in past 6 months.  Recommended to call us back if you have any question of any concern.  Follow-up in 6 months.  Follow Up Instructions:    I discussed the assessment and treatment plan with the patient. The patient was provided an opportunity to ask questions and all were answered. The patient agreed with  the plan and demonstrated an understanding of the instructions.   The patient was advised to call back or seek an in-person evaluation if the symptoms worsen or if the condition fails to improve as anticipated.  I provided 20 minutes of non-face-to-face time during this encounter.   Kathlee Nations, MD

## 2020-01-23 ENCOUNTER — Ambulatory Visit: Payer: Self-pay | Attending: Family

## 2020-01-23 DIAGNOSIS — Z23 Encounter for immunization: Secondary | ICD-10-CM | POA: Insufficient documentation

## 2020-01-23 NOTE — Progress Notes (Signed)
   Covid-19 Vaccination Clinic  Name:  Wesley Hudson    MRN: [id number] DOB: [birth year]  01/23/2020  Mr. Wesley Hudson was observed post Covid-19 immunization for 15 minutes without incidence. He was provided with Vaccine Information Sheet and instruction to access the V-Safe system.   Mr. Wesley Hudson was instructed to call 911 with any severe reactions post vaccine: [name] Difficulty breathing  . Swelling of your face and throat  . A fast heartbeat  . A bad rash all over your body  . Dizziness and weakness    Immunizations Administered    Name Date Dose VIS Date Route   Moderna COVID-19 Vaccine 01/23/2020  2:03 PM 0.5 mL 10/29/2019 Intramuscular   Manufacturer: Moderna   Lot: [id number]   NDC: 61483-073-54

## 2020-02-25 ENCOUNTER — Ambulatory Visit: Payer: Self-pay | Attending: Family

## 2020-02-25 DIAGNOSIS — Z23 Encounter for immunization: Secondary | ICD-10-CM

## 2020-02-25 NOTE — Progress Notes (Signed)
   Covid-19 Vaccination Clinic  Name:  Wesley Hudson    MRN: [id number] DOB: 1981-07-14  02/25/2020  [name] was observed post Covid-19 immunization for 15 minutes without incident. He was provided with Vaccine Information Sheet and instruction to access the V-Safe system.   [name] was instructed to call 911 with any severe reactions post vaccine: [name] Difficulty breathing  . Swelling of face and throat  . A fast heartbeat  . A bad rash all over body  . Dizziness and weakness   Immunizations Administered    Name Date Dose VIS Date Route   Moderna COVID-19 Vaccine 02/25/2020  2:45 PM 0.5 mL 10/29/2019 Intramuscular   Manufacturer: Moderna   Lot: [id number]   NDC: 17915-056-97

## 2020-03-24 DIAGNOSIS — Z03818 Encounter for observation for suspected exposure to other biological agents ruled out: Secondary | ICD-10-CM | POA: Diagnosis not present

## 2020-04-13 ENCOUNTER — Other Ambulatory Visit: Payer: Self-pay

## 2020-04-13 ENCOUNTER — Telehealth (HOSPITAL_COMMUNITY): Payer: Self-pay | Admitting: Psychiatry

## 2020-04-14 ENCOUNTER — Other Ambulatory Visit: Payer: Self-pay

## 2020-04-14 ENCOUNTER — Telehealth (INDEPENDENT_AMBULATORY_CARE_PROVIDER_SITE_OTHER): Payer: Self-pay | Admitting: Psychiatry

## 2020-04-14 ENCOUNTER — Encounter (HOSPITAL_COMMUNITY): Payer: Self-pay | Admitting: Psychiatry

## 2020-04-14 VITALS — Wt 185.0 lb

## 2020-04-14 DIAGNOSIS — F411 Generalized anxiety disorder: Secondary | ICD-10-CM

## 2020-04-14 DIAGNOSIS — F331 Major depressive disorder, recurrent, moderate: Secondary | ICD-10-CM

## 2020-04-14 MED ORDER — HYDROXYZINE PAMOATE 25 MG PO CAPS: 25.0000 mg | ORAL_CAPSULE | Freq: Every evening | ORAL | 1 refills | Status: DC | PRN

## 2020-04-14 MED ORDER — VENLAFAXINE HCL ER 75 MG PO CP24: 75.0000 mg | ORAL_CAPSULE | Freq: Every day | ORAL | 0 refills | Status: DC

## 2020-04-14 NOTE — Progress Notes (Signed)
Virtual Visit via Telephone Note  I connected with Wesley Hudson on 04/14/20 at  8:40 AM EDT by telephone and verified that I am speaking with the correct person using two identifiers.   I discussed the limitations, risks, security and privacy concerns of performing an evaluation and management service by telephone and the availability of in person appointments. I also discussed with the patient that there may be a patient responsible charge related to this service. The patient expressed understanding and agreed to proceed.   History of Present Illness: Patient is evaluated through phone session.  He endorsed lately having increased anxiety, sweating and having dreams.  He is not sure what triggered but reported to started working in person 6 weeks ago and since then he has noticed.  He admitted a lot of stress since college year is ending and he gets stressed.  However he denies any crying spells suicidal thoughts or any feeling of hopelessness.  He is going in person for work.  His family is doing well.  He has cut down his cannabis use since using more CBD.  He continues to try losing weight and since last year he has lost more than 10 pounds.  He has no tremors, shakes or any EPS.  He denies any mania or psychosis.  He has not seen his PCP but hoping to get physical and blood work soon.  His primary care physician is Dr. Clarene Duke at [facility].   Past Psychiatric History:Reviewed. H/Otreatment since age 25 after arrested for drug charges.H/ODUI at age 22.H/Oheavy drinking and cannabis use.SawDr. Levonne Hubert schoolage andgiven Adderall.H/O depression and anxiety. TriedZoloft byPCPbutstopped after 2 months.No H/Omania, psychosis, hallucination, aggression or violence.    Psychiatric Specialty Exam: Physical Exam  Review of Systems  Weight 185 lb (83.9 kg).There is no height or weight on file to calculate BMI.  General Appearance: NA  Eye Contact:  NA  Speech:  Clear and  Coherent  Volume:  Normal  Mood:  Anxious  Affect:  NA  Thought Process:  Goal Directed  Orientation:  Full (Time, Place, and Person)  Thought Content:  Rumination  Suicidal Thoughts:  No  Homicidal Thoughts:  No  Memory:  Immediate;   Good Recent;   Good Remote;   Good  Judgement:  Intact  Insight:  Present  Psychomotor Activity:  NA  Concentration:  Concentration: Good and Attention Span: Good  Recall:  Good  Fund of Knowledge:  Good  Language:  Good  Akathisia:  No  Handed:  Right  AIMS (if indicated):     Assets:  Communication Skills Desire for Improvement Housing Resilience Social Support Talents/Skills Transportation  ADL's:  Intact  Cognition:  WNL  Sleep:   fair, dreams, sweating      Assessment and Plan: Major depressive disorder, recurrent.  Generalized anxiety disorder.  Discussed recent anxiety which could be due to work related.  Recommended to try hydroxyzine 25-50 mg to help with anxiety and insomnia and dreams.  Continue venlafaxine at present dose.  Discussed medication side effects and recommended hydroxyzine need to take before going to bed.  Recommended to call us back if he has any questions or any concerns.  Follow-up in 3 months.  Follow Up Instructions:    I discussed the assessment and treatment plan with the patient. The patient was provided an opportunity to ask questions and all were answered. The patient agreed with the plan and demonstrated an understanding of the instructions.   The patient was advised  to call back or seek an in-person evaluation if the symptoms worsen or if the condition fails to improve as anticipated.  I provided 20 minutes of non-face-to-face time during this encounter.   Kathlee Nations, MD

## 2020-05-20 ENCOUNTER — Other Ambulatory Visit (HOSPITAL_COMMUNITY): Payer: Self-pay | Admitting: Psychiatry

## 2020-05-20 DIAGNOSIS — F411 Generalized anxiety disorder: Secondary | ICD-10-CM

## 2020-07-15 ENCOUNTER — Other Ambulatory Visit: Payer: Self-pay

## 2020-07-15 ENCOUNTER — Encounter (HOSPITAL_COMMUNITY): Payer: Self-pay | Admitting: Psychiatry

## 2020-07-15 ENCOUNTER — Telehealth (INDEPENDENT_AMBULATORY_CARE_PROVIDER_SITE_OTHER): Payer: Self-pay | Admitting: Psychiatry

## 2020-07-15 DIAGNOSIS — F331 Major depressive disorder, recurrent, moderate: Secondary | ICD-10-CM

## 2020-07-15 DIAGNOSIS — F411 Generalized anxiety disorder: Secondary | ICD-10-CM

## 2020-07-15 MED ORDER — VENLAFAXINE HCL ER 75 MG PO CP24: 75.0000 mg | ORAL_CAPSULE | Freq: Every day | ORAL | 0 refills | Status: DC

## 2020-07-15 NOTE — Progress Notes (Signed)
Virtual Visit via Telephone Note  I connected with Wesley Hudson on 07/15/20 at  8:40 AM EDT by telephone and verified that I am speaking with the correct person using two identifiers.  Location: Patient: work Provider: home office   I discussed the limitations, risks, security and privacy concerns of performing an evaluation and management service by telephone and the availability of in person appointments. I also discussed with the patient that there may be a patient responsible charge related to this service. The patient expressed understanding and agreed to proceed.   History of Present Illness: Patient is evaluated by phone session.  He has taken a few times hydroxyzine which helps his anxiety and nervousness.  It also helps with sleep.  He is feeling better since started working in person.  He is more comfortable now going to work.  Denies any panic attack, crying spells or any feeling of hopelessness.  He had cut down overall his cannabis use.  He denies any mania, psychosis or any hallucination.  He lives with his parents wife and 29-year-old daughter.  Patient has no tremors, shakes or any EPS.  He apologized not seeing his PCP since he has been very busy but hoping to make an appointment for blood work and physical.  He has refill remaining on hydroxyzine but like to have a new prescription of venlafaxine.  Since he is taking as needed hydroxyzine he denies any sweating or any dreams.  His energy level is good.  Past Psychiatric History:Reviewed. H/Otreatment since age 40 after arrested for drug charges.H/ODUI at age 56.H/Oheavy drinking and cannabis use.SawDr. Lugosinceschoolage andgivenAdderall.H/O depression and anxiety. TriedZoloft byPCPbutstopped after 2 months.No H/Omania, psychosis, hallucination, aggression or violence.      Psychiatric Specialty Exam: Physical Exam  Review of Systems  Weight 192 lb (87.1 kg).There is no height or weight on file to  calculate BMI.  General Appearance: NA  Eye Contact:  NA  Speech:  Clear and Coherent  Volume:  Normal  Mood:  Euthymic  Affect:  NA  Thought Process:  Goal Directed  Orientation:  Full (Time, Place, and Person)  Thought Content:  WDL  Suicidal Thoughts:  No  Homicidal Thoughts:  No  Memory:  Immediate;   Good Recent;   Good Remote;   Good  Judgement:  Good  Insight:  Present  Psychomotor Activity:  NA  Concentration:  Concentration: Good and Attention Span: Good  Recall:  Good  Fund of Knowledge:  Good  Language:  Good  Akathisia:  No  Handed:  Right  AIMS (if indicated):     Assets:  Communication Skills Desire for Improvement Housing Resilience Social Support Talents/Skills Transportation  ADL's:  Intact  Cognition:  WNL  Sleep:   ok      Assessment and Plan: Major depressive disorder, recurrent.  Generalized anxiety disorder.  Patient doing well on his current medication.  He has hydroxyzine refill remaining and he has taken a few x1 When He Was Anxious.  Discussed Medication Side Effects and Benefits.  Continue Venlafaxine 75 Mg Daily and Hydroxyzine 25 Mg As Needed for Anxiety.  Recommended to Call us Back with Any Question or Any Concern.  Follow-Up in 3 Months.  Follow Up Instructions:    I discussed the assessment and treatment plan with the patient. The patient was provided an opportunity to ask questions and all were answered. The patient agreed with the plan and demonstrated an understanding of the instructions.   The patient was advised to  call back or seek an in-person evaluation if the symptoms worsen or if the condition fails to improve as anticipated.  I provided 15 minutes of non-face-to-face time during this encounter.   Kathlee Nations, MD

## 2020-08-11 DIAGNOSIS — Z03818 Encounter for observation for suspected exposure to other biological agents ruled out: Secondary | ICD-10-CM | POA: Diagnosis not present

## 2020-08-16 DIAGNOSIS — Z1152 Encounter for screening for COVID-19: Secondary | ICD-10-CM | POA: Diagnosis not present

## 2020-08-31 DIAGNOSIS — Z03818 Encounter for observation for suspected exposure to other biological agents ruled out: Secondary | ICD-10-CM | POA: Diagnosis not present

## 2020-10-07 ENCOUNTER — Other Ambulatory Visit (HOSPITAL_COMMUNITY): Payer: Self-pay | Admitting: Psychiatry

## 2020-10-07 DIAGNOSIS — F411 Generalized anxiety disorder: Secondary | ICD-10-CM

## 2020-10-07 DIAGNOSIS — F331 Major depressive disorder, recurrent, moderate: Secondary | ICD-10-CM

## 2020-10-12 DIAGNOSIS — Z20822 Contact with and (suspected) exposure to covid-19: Secondary | ICD-10-CM | POA: Diagnosis not present

## 2020-10-16 ENCOUNTER — Telehealth (HOSPITAL_COMMUNITY): Payer: Self-pay | Admitting: Psychiatry

## 2020-10-27 ENCOUNTER — Telehealth (INDEPENDENT_AMBULATORY_CARE_PROVIDER_SITE_OTHER): Payer: Self-pay | Admitting: Psychiatry

## 2020-10-27 ENCOUNTER — Encounter (HOSPITAL_COMMUNITY): Payer: Self-pay | Admitting: Psychiatry

## 2020-10-27 ENCOUNTER — Other Ambulatory Visit: Payer: Self-pay

## 2020-10-27 VITALS — Wt 190.0 lb

## 2020-10-27 DIAGNOSIS — Z03818 Encounter for observation for suspected exposure to other biological agents ruled out: Secondary | ICD-10-CM | POA: Diagnosis not present

## 2020-10-27 DIAGNOSIS — F331 Major depressive disorder, recurrent, moderate: Secondary | ICD-10-CM

## 2020-10-27 DIAGNOSIS — F419 Anxiety disorder, unspecified: Secondary | ICD-10-CM

## 2020-10-27 MED ORDER — VENLAFAXINE HCL ER 75 MG PO CP24: 75.0000 mg | ORAL_CAPSULE | Freq: Every day | ORAL | 0 refills | Status: DC

## 2020-10-27 NOTE — Progress Notes (Signed)
Virtual Visit via Telephone Note  I connected with Wesley Hudson on 10/27/20 at 11:00 AM EST by telephone and verified that I am speaking with the correct person using two identifiers.  Location: Patient: Work Provider: Economist   I discussed the limitations, risks, security and privacy concerns of performing an evaluation and management service by telephone and the availability of in person appointments. I also discussed with the patient that there may be a patient responsible charge related to this service. The patient expressed understanding and agreed to proceed.   History of Present Illness: Patient is evaluated by the phone session.  He is on the phone by himself.  He has been taking venlafaxine and occasionally take hydroxyzine to help with anxiety.  He recently had overseas trip to [place] which was job related.  He is very pleased and relaxed as he came back on time before the new variant news started in the media.  He feels overall things are going well.  He is sleeping good.  He had a quiet Thanksgiving but he had a plan to go to mountains on the Christmas.  Denies any major panic attack.  Denies any crying spells or any feeling of hopelessness or worthlessness.  He feels medicine is working and he has no more crying spells or any feeling of hopelessness.  His energy level is good.  He has not done any physical or blood work but going to schedule appointment with PCP very soon.  He has no tremors, shakes or any EPS.  Past Psychiatric History:Reviewed. H/Otreatmentsince age 17afterarrested for drug charges.H/ODUI at age 45.H/Oheavy drinking and cannabis use.SawDr. Lugosinceschoolage andgivenAdderall.H/O depression and anxiety. TriedZoloft byPCPbutstopped after 2 months.No H/Omania, psychosis, hallucination, aggression or violence.     Psychiatric Specialty Exam: Physical Exam  Review of Systems  Weight 190 lb (86.2 kg).There is no height or weight on file to  calculate BMI.  General Appearance: NA  Eye Contact:  NA  Speech:  Clear and Coherent  Volume:  Normal  Mood:  Euthymic  Affect:  NA  Thought Process:  Goal Directed  Orientation:  Full (Time, Place, and Person)  Thought Content:  WDL  Suicidal Thoughts:  No  Homicidal Thoughts:  No  Memory:  Immediate;   Good Recent;   Good Remote;   Good  Judgement:  Good  Insight:  Present  Psychomotor Activity:  NA  Concentration:  Concentration: Good and Attention Span: Good  Recall:  Good  Fund of Knowledge:  Good  Language:  Good  Akathisia:  No  Handed:  Right  AIMS (if indicated):     Assets:  Communication Skills Desire for Improvement Housing Resilience Social Support Talents/Skills  ADL's:  Intact  Cognition:  WNL  Sleep:   ok      Assessment and Plan: Major depressive disorder, recurrent.  Anxiety.  Patient is a stable on his current medication.  He has refill remaining on hydroxyzine but does need venlafaxine 75 mg which he takes every day.  Discussed medication side effects and benefits.  Patient recall during the travel he had missed the dose of venlafaxine and did not feel better.  We talked about possible withdrawals from the venlafaxine and he need to take the medicine on time.  Recommended to call us back if is any question or any concern.  Follow-up in 3 months.  Follow Up Instructions:    I discussed the assessment and treatment plan with the patient. The patient was provided an opportunity to  ask questions and all were answered. The patient agreed with the plan and demonstrated an understanding of the instructions.   The patient was advised to call back or seek an in-person evaluation if the symptoms worsen or if the condition fails to improve as anticipated.  I provided 16 minutes of non-face-to-face time during this encounter.   Cleotis Nipper, MD

## 2020-11-17 ENCOUNTER — Other Ambulatory Visit (HOSPITAL_COMMUNITY): Payer: Self-pay | Admitting: *Deleted

## 2020-11-17 DIAGNOSIS — F419 Anxiety disorder, unspecified: Secondary | ICD-10-CM

## 2020-11-17 DIAGNOSIS — F331 Major depressive disorder, recurrent, moderate: Secondary | ICD-10-CM

## 2020-11-17 MED ORDER — VENLAFAXINE HCL ER 75 MG PO CP24: 75.0000 mg | ORAL_CAPSULE | Freq: Every day | ORAL | 0 refills | Status: DC

## 2020-11-24 ENCOUNTER — Other Ambulatory Visit (HOSPITAL_COMMUNITY): Payer: Self-pay | Admitting: Psychiatry

## 2020-11-24 DIAGNOSIS — F419 Anxiety disorder, unspecified: Secondary | ICD-10-CM

## 2020-11-24 DIAGNOSIS — F331 Major depressive disorder, recurrent, moderate: Secondary | ICD-10-CM

## 2020-11-27 DIAGNOSIS — Z131 Encounter for screening for diabetes mellitus: Secondary | ICD-10-CM | POA: Diagnosis not present

## 2020-11-27 DIAGNOSIS — Z0000 Encounter for general adult medical examination without abnormal findings: Secondary | ICD-10-CM | POA: Diagnosis not present

## 2020-11-27 DIAGNOSIS — Z1339 Encounter for screening examination for other mental health and behavioral disorders: Secondary | ICD-10-CM | POA: Diagnosis not present

## 2020-11-27 DIAGNOSIS — Z1331 Encounter for screening for depression: Secondary | ICD-10-CM | POA: Diagnosis not present

## 2020-12-09 DIAGNOSIS — F411 Generalized anxiety disorder: Secondary | ICD-10-CM | POA: Diagnosis not present

## 2020-12-09 DIAGNOSIS — E782 Mixed hyperlipidemia: Secondary | ICD-10-CM | POA: Diagnosis not present

## 2021-01-25 ENCOUNTER — Other Ambulatory Visit: Payer: Self-pay

## 2021-01-25 ENCOUNTER — Telehealth (HOSPITAL_COMMUNITY): Payer: Self-pay | Admitting: *Deleted

## 2021-01-25 ENCOUNTER — Encounter (HOSPITAL_COMMUNITY): Payer: Self-pay | Admitting: Psychiatry

## 2021-01-25 ENCOUNTER — Telehealth (INDEPENDENT_AMBULATORY_CARE_PROVIDER_SITE_OTHER): Payer: Self-pay | Admitting: Psychiatry

## 2021-01-25 VITALS — Wt 200.0 lb

## 2021-01-25 DIAGNOSIS — F419 Anxiety disorder, unspecified: Secondary | ICD-10-CM

## 2021-01-25 DIAGNOSIS — F331 Major depressive disorder, recurrent, moderate: Secondary | ICD-10-CM

## 2021-01-25 MED ORDER — VENLAFAXINE HCL ER 75 MG PO CP24
75.0000 mg | ORAL_CAPSULE | Freq: Every day | ORAL | 0 refills | Status: DC
Start: 2021-01-25 — End: 2021-04-20

## 2021-01-25 MED ORDER — HYDROXYZINE PAMOATE 25 MG PO CAPS
25.0000 mg | ORAL_CAPSULE | Freq: Every evening | ORAL | 0 refills | Status: DC | PRN
Start: 2021-01-25 — End: 2021-02-23

## 2021-01-25 NOTE — Progress Notes (Signed)
Virtual Visit via Telephone Note  I connected with Wesley Hudson on 01/25/21 at  8:40 AM EST by telephone and verified that I am speaking with the correct person using two identifiers.  Location: Patient: Work Provider: Economist   I discussed the limitations, risks, security and privacy concerns of performing an evaluation and management service by telephone and the availability of in person appointments. I also discussed with the patient that there may be a patient responsible charge related to this service. The patient expressed understanding and agreed to proceed.   History of Present Illness: Patient is evaluated by the phone session.  He is taking hydroxyzine as needed but taking venlafaxine every day which helps his anxiety and depression.  He is back to work and he feels work is pick up and that is helping his daily routine.  Some nights he has trouble sleeping because of sinusitis will be also reported his wife concerned about his snoring.  He is scheduled to see his ENT for further work-up and if needed sleep study.  He admitted weight gain during the holidays but he is trying to lose weight.  He denies any crying spells or any feeling of hopelessness or worthlessness.  He denies any anhedonia.  Recently he had a blood work and he was told everything is normal.  He has no tremors, shakes or any EPS.  He is excited about upcoming trip to [place] for educational tour.  His job is going well.  He does not want to change the medication.  He also reported there are no major concern or side effects from the medication.   Past Psychiatric History:Reviewed. H/Otreatmentsince age 17afterarrested for drug charges.H/ODUI at age 40.H/Oheavy drinking and cannabis use.SawDr. Lugosinceschoolage andgivenAdderall.H/O depression and anxiety. TriedZoloft byPCPbutstopped after 2 months.No H/Omania, psychosis, hallucination, aggression or violence.     Psychiatric  Specialty Exam: Physical Exam  Review of Systems  Weight 200 lb (90.7 kg).There is no height or weight on file to calculate BMI.  General Appearance: NA  Eye Contact:  NA  Speech:  Clear and Coherent  Volume:  Normal  Mood:  Euthymic  Affect:  NA  Thought Process:  Goal Directed  Orientation:  Full (Time, Place, and Person)  Thought Content:  WDL  Suicidal Thoughts:  No  Homicidal Thoughts:  No  Memory:  Immediate;   Good Recent;   Good Remote;   Good  Judgement:  Intact  Insight:  Present  Psychomotor Activity:  NA  Concentration:  Concentration: Good and Attention Span: Good  Recall:  Good  Fund of Knowledge:  Good  Language:  Good  Akathisia:  No  Handed:  Right  AIMS (if indicated):     Assets:  Communication Skills Desire for Improvement Housing Resilience Social Support Talents/Skills Transportation  ADL's:  Intact  Cognition:  WNL  Sleep:   fair      Assessment and Plan: Major depressive disorder, recurrent.  Anxiety.  Patient is a stable on his current medication.  He takes venlafaxine 75 mg in the morning and rarely takes the hydroxyzine.  He has no tremors shakes or any EPS.  Encouraged healthy lifestyle and watch his calorie intake.  Continue venlafaxine 75 mg daily and hydroxyzine 25 mg to take as needed for anxiety.  Recommended to call us back if is any question or any concern.  We will contact [facility] at [phone] where he had blood work and we will get the results.  I  recommended to call us back if is any question or any concern.  Follow-up in 3 months.  Follow Up Instructions:    I discussed the assessment and treatment plan with the patient. The patient was provided an opportunity to ask questions and all were answered. The patient agreed with the plan and demonstrated an understanding of the instructions.   The patient was advised to call back or seek an in-person evaluation if the symptoms worsen or if the condition fails to  improve as anticipated.  I provided 16 minutes of non-face-to-face time during this encounter.   Cleotis Nipper, MD

## 2021-01-25 NOTE — Telephone Encounter (Signed)
Writer has attempted to contact [facility] several times to ask for a copy of latest blood work, finally leaving a VM for nurse. FYI.

## 2021-01-26 DIAGNOSIS — J310 Chronic rhinitis: Secondary | ICD-10-CM | POA: Diagnosis not present

## 2021-01-26 DIAGNOSIS — R0683 Snoring: Secondary | ICD-10-CM | POA: Diagnosis not present

## 2021-01-26 DIAGNOSIS — J343 Hypertrophy of nasal turbinates: Secondary | ICD-10-CM | POA: Diagnosis not present

## 2021-01-26 DIAGNOSIS — J3489 Other specified disorders of nose and nasal sinuses: Secondary | ICD-10-CM | POA: Diagnosis not present

## 2021-02-08 DIAGNOSIS — Z03818 Encounter for observation for suspected exposure to other biological agents ruled out: Secondary | ICD-10-CM | POA: Diagnosis not present

## 2021-02-17 ENCOUNTER — Other Ambulatory Visit (HOSPITAL_COMMUNITY): Payer: Self-pay | Admitting: Psychiatry

## 2021-02-17 DIAGNOSIS — F419 Anxiety disorder, unspecified: Secondary | ICD-10-CM

## 2021-03-08 DIAGNOSIS — Z03818 Encounter for observation for suspected exposure to other biological agents ruled out: Secondary | ICD-10-CM | POA: Diagnosis not present

## 2021-03-17 ENCOUNTER — Other Ambulatory Visit (HOSPITAL_COMMUNITY): Payer: Self-pay | Admitting: Psychiatry

## 2021-03-17 DIAGNOSIS — F419 Anxiety disorder, unspecified: Secondary | ICD-10-CM

## 2021-04-19 ENCOUNTER — Other Ambulatory Visit (HOSPITAL_COMMUNITY): Payer: Self-pay | Admitting: Psychiatry

## 2021-04-19 DIAGNOSIS — F419 Anxiety disorder, unspecified: Secondary | ICD-10-CM

## 2021-04-20 ENCOUNTER — Other Ambulatory Visit: Payer: Self-pay

## 2021-04-20 ENCOUNTER — Telehealth (INDEPENDENT_AMBULATORY_CARE_PROVIDER_SITE_OTHER): Payer: BC Managed Care – PPO | Admitting: Psychiatry

## 2021-04-20 ENCOUNTER — Encounter (HOSPITAL_COMMUNITY): Payer: Self-pay | Admitting: Psychiatry

## 2021-04-20 DIAGNOSIS — F419 Anxiety disorder, unspecified: Secondary | ICD-10-CM

## 2021-04-20 DIAGNOSIS — F331 Major depressive disorder, recurrent, moderate: Secondary | ICD-10-CM | POA: Diagnosis not present

## 2021-04-20 MED ORDER — VENLAFAXINE HCL ER 75 MG PO CP24
75.0000 mg | ORAL_CAPSULE | Freq: Every day | ORAL | 0 refills | Status: DC
Start: 2021-04-20 — End: 2021-07-21

## 2021-04-20 NOTE — Progress Notes (Signed)
Virtual Visit via Telephone Note  I connected with Wesley Hudson on 04/20/21 at  8:40 AM EDT by telephone and verified that I am speaking with the correct person using two identifiers.  Location: Patient: Work Provider: Economist   I discussed the limitations, risks, security and privacy concerns of performing an evaluation and management service by telephone and the availability of in person appointments. I also discussed with the patient that there may be a patient responsible charge related to this service. The patient expressed understanding and agreed to proceed.   History of Present Illness: Patient is evaluated by phone session.  He is taking venlafaxine every day which is helping his depression.  He occasionally take hydroxyzine when he cannot sleep and feels very nervous and anxious.  He had a visit with ENT for sinusitis and recommended to have MRI but he is waiting for the appointment.  He admitted to still sometimes difficulty sleeping and snoring.  He admitted few pounds weight gain as not following his diet.  He had a trip to [place] and he had a good time there it was a education to her.  Now he is going to [place] and later going to [place] [address] with the family.  He reported his job is going okay.  Sometimes he feels overwhelmed but able to handle and manage his anxiety and depression.  He denies any tremors, shakes, severe panic attack, feeling of hopelessness or worthlessness.  He denies any suicidal thoughts.  He like to keep his current medication.  We have called in [facility] for the blood work however we did not receive results.  Patient admitted social drinking on occasions but denies any intoxication, seizures, withdrawals.  Past Psychiatric History:Reviewed. H/Otreatmentsince age 17afterarrested for drug charges.H/ODUI at age 78.H/Oheavy drinking and cannabis use.SawDr. Lugosinceschoolage andgivenAdderall.H/O depression and anxiety.  TriedZoloft byPCPbutstopped after 2 months.No H/Omania, psychosis, hallucination, aggression or violence.    Psychiatric Specialty Exam: Physical Exam  Review of Systems  Weight 205 lb (93 kg).There is no height or weight on file to calculate BMI.  General Appearance: NA  Eye Contact:  NA  Speech:  Clear and Coherent  Volume:  Normal  Mood:  Euthymic  Affect:  NA  Thought Process:  Goal Directed  Orientation:  Full (Time, Place, and Person)  Thought Content:  Logical  Suicidal Thoughts:  No  Homicidal Thoughts:  No  Memory:  Immediate;   Good Recent;   Good Remote;   Good  Judgement:  Intact  Insight:  Present  Psychomotor Activity:  NA  Concentration:  Concentration: Good and Attention Span: Good  Recall:  Good  Fund of Knowledge:  Good  Language:  Good  Akathisia:  No  Handed:  Right  AIMS (if indicated):     Assets:  Communication Skills Desire for Improvement Housing Resilience Social Support Talents/Skills Transportation  ADL's:  Intact  Cognition:  WNL  Sleep:   better      Assessment and Plan: Major depressive disorder, recurrent.  Anxiety  Patient is stable on his medication.  He rarely takes hydroxyzine and does not need a new refill at this time.  I discussed if he continues to have persistent snoring and not getting resting sleep then he may need to consider a sleep study.  He is hoping if MRI is normal then we will talk to his physician to consider sleep study.  Overall he does not want to change the medication.  We have not received  blood work results from [facility] and patient will call them to release information and request labs to fax [place].  Encourage healthy lifestyle and watch his calorie intake.  Discussed medication side effects and benefits.  Recommended to call us back if is any question or any concern.  Follow-up in 3 months.  Follow Up Instructions:    I discussed the assessment and treatment plan with the patient. The  patient was provided an opportunity to ask questions and all were answered. The patient agreed with the plan and demonstrated an understanding of the instructions.   The patient was advised to call back or seek an in-person evaluation if the symptoms worsen or if the condition fails to improve as anticipated.  I provided 19 minutes of non-face-to-face time during this encounter.   Cleotis Nipper, MD

## 2021-05-02 DIAGNOSIS — Z20822 Contact with and (suspected) exposure to covid-19: Secondary | ICD-10-CM | POA: Diagnosis not present

## 2021-06-12 DIAGNOSIS — R82998 Other abnormal findings in urine: Secondary | ICD-10-CM | POA: Diagnosis not present

## 2021-06-12 DIAGNOSIS — A499 Bacterial infection, unspecified: Secondary | ICD-10-CM | POA: Diagnosis not present

## 2021-06-12 DIAGNOSIS — N390 Urinary tract infection, site not specified: Secondary | ICD-10-CM | POA: Diagnosis not present

## 2021-06-12 DIAGNOSIS — R300 Dysuria: Secondary | ICD-10-CM | POA: Diagnosis not present

## 2021-06-22 DIAGNOSIS — J343 Hypertrophy of nasal turbinates: Secondary | ICD-10-CM | POA: Diagnosis not present

## 2021-06-22 DIAGNOSIS — J342 Deviated nasal septum: Secondary | ICD-10-CM | POA: Diagnosis not present

## 2021-06-22 DIAGNOSIS — J329 Chronic sinusitis, unspecified: Secondary | ICD-10-CM | POA: Diagnosis not present

## 2021-06-22 DIAGNOSIS — J310 Chronic rhinitis: Secondary | ICD-10-CM | POA: Diagnosis not present

## 2021-06-22 DIAGNOSIS — J3489 Other specified disorders of nose and nasal sinuses: Secondary | ICD-10-CM | POA: Diagnosis not present

## 2021-07-20 ENCOUNTER — Other Ambulatory Visit (HOSPITAL_COMMUNITY): Payer: Self-pay | Admitting: Psychiatry

## 2021-07-20 DIAGNOSIS — F419 Anxiety disorder, unspecified: Secondary | ICD-10-CM

## 2021-07-20 DIAGNOSIS — F331 Major depressive disorder, recurrent, moderate: Secondary | ICD-10-CM

## 2021-07-21 ENCOUNTER — Telehealth (INDEPENDENT_AMBULATORY_CARE_PROVIDER_SITE_OTHER): Payer: BC Managed Care – PPO | Admitting: Psychiatry

## 2021-07-21 ENCOUNTER — Other Ambulatory Visit: Payer: Self-pay

## 2021-07-21 ENCOUNTER — Encounter (HOSPITAL_COMMUNITY): Payer: Self-pay | Admitting: Psychiatry

## 2021-07-21 DIAGNOSIS — F331 Major depressive disorder, recurrent, moderate: Secondary | ICD-10-CM

## 2021-07-21 DIAGNOSIS — F419 Anxiety disorder, unspecified: Secondary | ICD-10-CM | POA: Diagnosis not present

## 2021-07-21 MED ORDER — VENLAFAXINE HCL ER 75 MG PO CP24: 75.0000 mg | ORAL_CAPSULE | Freq: Every day | ORAL | 0 refills | Status: DC

## 2021-07-21 NOTE — Progress Notes (Signed)
Virtual Visit via Telephone Note  I connected with Wesley Hudson on 07/21/21 at  8:40 AM EDT by telephone and verified that I am speaking with the correct person using two identifiers.  Location: Patient: home Provider: home office   I discussed the limitations, risks, security and privacy concerns of performing an evaluation and management service by telephone and the availability of in person appointments. I also discussed with the patient that there may be a patient responsible charge related to this service. The patient expressed understanding and agreed to proceed.   History of Present Illness: Patient is evaluated by phone session.  He feels his symptoms are manageable and stable.  He denies any major panic attack or anxiety attack and his depression is much better.  He does not feel overwhelmed and his job is going well.  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  In February he is going to [place] in a conference and is looking forward to that trip.  He denies any crying spells, feeling of hopelessness or worthlessness.  His daughter started kindergarten.  His appetite is okay.  Energy level is good.  He had an MRI off his sinuses and he was told no major concern.  He is sleeping better and sometimes he takes the hydroxyzine that helps sleep and anxiety.  He does not feel he need a sleep study.  He has no tremor or shakes or any EPS.  He takes venlafaxine every day.  He admitted social drinking but denies intoxication, withdrawal.  Like to keep his current medication.   Past Psychiatric History: Reviewed. H/O treatment since age 40 after arrested for drug charges. H/O DUI at age 40 H/O heavy drinking and cannabis use. Saw Dr. Dub Mikes since school age and given Adderall. H/O depression and anxiety. Tried Zoloft by PCP but stopped after 2 months. No H/O mania, psychosis, hallucination, aggression or violence.    Psychiatric Specialty  Exam: Physical Exam  Review of Systems  Weight 204 lb (92.5 kg).There is no height or weight on file to calculate BMI.  General Appearance: NA  Eye Contact:  NA  Speech:  Clear and Coherent  Volume:  Normal  Mood:  Euthymic  Affect:  NA  Thought Process:  Goal Directed  Orientation:  Full (Time, Place, and Person)  Thought Content:  Logical  Suicidal Thoughts:  No  Homicidal Thoughts:  No  Memory:  Immediate;   Good Recent;   Good Remote;   Good  Judgement:  Intact  Insight:  Fair  Psychomotor Activity:  NA  Concentration:  Concentration: Good and Attention Span: Good  Recall:  Good  Fund of Knowledge:  Good  Language:  Good  Akathisia:  No  Handed:  Right  AIMS (if indicated):     Assets:  Communication Skills Desire for Improvement Housing Resilience Social Support Talents/Skills Transportation  ADL's:  Intact  Cognition:  WNL  Sleep:   ok     Assessment and Plan: Major depressive disorder, recurrent.  Anxiety.  Patient is a stable and his symptoms are manageable on venlafaxine.  He rarely takes hydroxyzine and does not need a new refill at this time.  He had a blood work but he was also told everything is normal from [facility].  Continue venlafaxine 75 mg daily.  Recommended to call us back if there is any question of any concern.  Follow-up in 3 months.  Follow Up Instructions:    I discussed the assessment and treatment plan with the patient. The patient was provided an opportunity to ask questions and all were answered. The patient agreed with the plan and demonstrated an understanding of the instructions.   The patient was advised to call back or seek an in-person evaluation if the symptoms worsen or if the condition fails to improve as anticipated.  I provided 20 minutes of non-face-to-face time during this encounter.   Cleotis Nipper, MD

## 2021-10-11 ENCOUNTER — Encounter (HOSPITAL_COMMUNITY): Payer: Self-pay | Admitting: Psychiatry

## 2021-10-11 ENCOUNTER — Telehealth (HOSPITAL_BASED_OUTPATIENT_CLINIC_OR_DEPARTMENT_OTHER): Payer: BC Managed Care – PPO | Admitting: Psychiatry

## 2021-10-11 ENCOUNTER — Other Ambulatory Visit: Payer: Self-pay

## 2021-10-11 DIAGNOSIS — F331 Major depressive disorder, recurrent, moderate: Secondary | ICD-10-CM

## 2021-10-11 DIAGNOSIS — F419 Anxiety disorder, unspecified: Secondary | ICD-10-CM

## 2021-10-11 MED ORDER — VENLAFAXINE HCL ER 75 MG PO CP24: 75.0000 mg | ORAL_CAPSULE | Freq: Every day | ORAL | 0 refills | Status: DC

## 2021-10-11 NOTE — Progress Notes (Signed)
Virtual Visit via Telephone Note  I connected with Wesley Hudson on 10/11/21 at  8:40 AM EST by telephone and verified that I am speaking with the correct person using two identifiers.  Location: Patient: Work Provider: Economist   I discussed the limitations, risks, security and privacy concerns of performing an evaluation and management service by telephone and the availability of in person appointments. I also discussed with the patient that there may be a patient responsible charge related to this service. The patient expressed understanding and agreed to proceed.   History of Present Illness: Patient is evaluated by phone session.  He is doing very well and stopped the smoking and drinking as his wife is expecting and having a second child in March.  He also decided to change his job and moving to [employer] [facility] [employer].  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  He is happy about his new position which he will start in January.  He is trying to lose weight and has lost 3 pounds since the last visit.  He is sleeping good.  Sometimes he takes hydroxyzine when he feels anxious but denies any major panic attack, crying spells or any feeling of hopelessness.  He is taking venlafaxine every day which is helping his depression and anxiety.  He does not want to change the medication.  He is looking for electrical vehicle so he can commute to [facility] when he start the work in January.  He is not sure if he will move there since commute is 40 minutes.   Past Psychiatric History: Reviewed. H/O treatment since age 64 after arrested for drug charges. H/O DUI at age 43. H/O heavy drinking and cannabis use. Saw Dr. Dub Mikes since school age and given Adderall. H/O depression and anxiety. Tried Zoloft by PCP but stopped after 2 months. No H/O mania, psychosis, hallucination, aggression or violence.    Psychiatric Specialty Exam: Physical  Exam  Review of Systems  Weight 197 lb (89.4 kg).There is no height or weight on file to calculate BMI.  General Appearance: NA  Eye Contact:  NA  Speech:  Clear and Coherent and Normal Rate  Volume:  Normal  Mood:  Euthymic  Affect:  NA  Thought Process:  Goal Directed  Orientation:  Full (Time, Place, and Person)  Thought Content:  Logical  Suicidal Thoughts:  No  Homicidal Thoughts:  No  Memory:  Immediate;   Good Recent;   Good Remote;   Good  Judgement:  Good  Insight:  Good  Psychomotor Activity:  NA  Concentration:  Concentration: Good and Attention Span: Good  Recall:  Good  Fund of Knowledge:  Good  Language:  Good  Akathisia:  No  Handed:  Right  AIMS (if indicated):     Assets:  Communication Skills Desire for Improvement Housing Resilience Talents/Skills Transportation  ADL's:  Intact  Cognition:  WNL  Sleep:   ok      Assessment and Plan: Major depressive disorder, recurrent.  Anxiety.  Patient doing very well on Vyvanse 75 mg daily.  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  He has taken few times hydroxyzine but he still has an off and does not need a new prescription.  He is trying to lose weight and has lost 3 pounds since the last visit.  Continue Effexor 75 mg daily.  Recommended to call us back if there is any question or any concern.  Follow-up in 3 months.  Follow Up Instructions:    I discussed the assessment and treatment plan with the patient. The patient was provided an opportunity to ask questions and all were answered. The patient agreed with the plan and demonstrated an understanding of the instructions.   The patient was advised to call back or seek an in-person evaluation if the symptoms worsen or if the condition fails to improve as anticipated.  I provided 19 minutes of non-face-to-face time during this encounter.   Cleotis Nipper, MD

## 2022-01-10 ENCOUNTER — Telehealth (HOSPITAL_COMMUNITY): Payer: BC Managed Care – PPO | Admitting: Psychiatry

## 2022-01-10 ENCOUNTER — Other Ambulatory Visit: Payer: Self-pay

## 2022-01-18 ENCOUNTER — Encounter (HOSPITAL_COMMUNITY): Payer: Self-pay

## 2022-01-18 ENCOUNTER — Other Ambulatory Visit: Payer: Self-pay

## 2022-01-18 ENCOUNTER — Emergency Department (HOSPITAL_COMMUNITY)
Admission: EM | Admit: 2022-01-18 | Discharge: 2022-01-19 | Disposition: A | Discharge status: Home / Self Care | Payer: BC Managed Care – PPO | Attending: Emergency Medicine | Admitting: Emergency Medicine

## 2022-01-18 ENCOUNTER — Emergency Department (HOSPITAL_COMMUNITY): Payer: BC Managed Care – PPO

## 2022-01-18 DIAGNOSIS — D72829 Elevated white blood cell count, unspecified: Secondary | ICD-10-CM | POA: Insufficient documentation

## 2022-01-18 DIAGNOSIS — R6883 Chills (without fever): Secondary | ICD-10-CM | POA: Insufficient documentation

## 2022-01-18 DIAGNOSIS — Z20822 Contact with and (suspected) exposure to covid-19: Secondary | ICD-10-CM | POA: Diagnosis not present

## 2022-01-18 DIAGNOSIS — K5000 Crohn's disease of small intestine without complications: Secondary | ICD-10-CM | POA: Insufficient documentation

## 2022-01-18 DIAGNOSIS — R1031 Right lower quadrant pain: Secondary | ICD-10-CM | POA: Diagnosis not present

## 2022-01-18 LAB — COMPREHENSIVE METABOLIC PANEL
ALT: 17 U/L (ref 0–44)
AST: 11 U/L — ABNORMAL LOW (ref 15–41)
Albumin: 4.2 g/dL (ref 3.5–5.0)
Alkaline Phosphatase: 62 U/L (ref 38–126)
Anion gap: 7 (ref 5–15)
BUN: 10 mg/dL (ref 6–20)
CO2: 23 mmol/L (ref 22–32)
Calcium: 9.1 mg/dL (ref 8.9–10.3)
Chloride: 103 mmol/L (ref 98–111)
Creatinine, Ser: 0.75 mg/dL (ref 0.61–1.24)
GFR, Estimated: >60 mL/min (ref >60)
Glucose, Bld: 106 mg/dL — ABNORMAL HIGH (ref 70–99)
Potassium: 3.7 mmol/L (ref 3.5–5.1)
Sodium: 133 mmol/L — ABNORMAL LOW (ref 135–145)
Total Bilirubin: 1.1 mg/dL (ref 0.3–1.2)
Total Protein: 7.7 g/dL (ref 6.5–8.1)

## 2022-01-18 LAB — CBC WITH DIFFERENTIAL/PLATELET
Abs Immature Granulocytes: 0.07 10*3/uL (ref 0.00–0.07)
Basophils Absolute: 0.1 10*3/uL (ref 0.0–0.1)
Basophils Relative: 0 %
Eosinophils Absolute: 0.1 10*3/uL (ref 0.0–0.5)
Eosinophils Relative: 1 %
HCT: 46.8 % (ref 39.0–52.0)
Hemoglobin: 15.3 g/dL (ref 13.0–17.0)
Immature Granulocytes: 1 %
Lymphocytes Relative: 16 %
Lymphs Abs: 2.2 10*3/uL (ref 0.7–4.0)
MCH: 30.4 pg (ref 26.0–34.0)
MCHC: 32.7 g/dL (ref 30.0–36.0)
MCV: 92.9 fL (ref 80.0–100.0)
Monocytes Absolute: 0.9 10*3/uL (ref 0.1–1.0)
Monocytes Relative: 6 %
Neutro Abs: 10.5 10*3/uL — ABNORMAL HIGH (ref 1.7–7.7)
Neutrophils Relative %: 76 %
Platelets: 267 10*3/uL (ref 150–400)
RBC: 5.04 MIL/uL (ref 4.22–5.81)
RDW: 12.6 % (ref 11.5–15.5)
WBC: 13.8 10*3/uL — ABNORMAL HIGH (ref 4.0–10.5)
nRBC: 0 % (ref 0.0–0.2)

## 2022-01-18 LAB — RESP PANEL BY RT-PCR (FLU A&B, COVID) ARPGX2
Influenza A by PCR: NEGATIVE
Influenza B by PCR: NEGATIVE
SARS Coronavirus 2 by RT PCR: NEGATIVE

## 2022-01-18 LAB — LIPASE, BLOOD: Lipase: 26 U/L (ref 11–51)

## 2022-01-18 MED ORDER — ONDANSETRON HCL 4 MG/2ML IJ SOLN
4.0000 mg | Freq: Once | INTRAMUSCULAR | Status: AC
  Administered 2022-01-19: 4 mg via INTRAVENOUS
  Filled 2022-01-18: qty 2

## 2022-01-18 MED ORDER — MORPHINE SULFATE (PF) 4 MG/ML IV SOLN
4.0000 mg | Freq: Once | INTRAVENOUS | Status: AC
  Administered 2022-01-19: 4 mg via INTRAVENOUS
  Filled 2022-01-18: qty 1

## 2022-01-18 MED ORDER — IOHEXOL 300 MG/ML  SOLN
100.0000 mL | Freq: Once | INTRAMUSCULAR | Status: AC | PRN
  Administered 2022-01-18: 100 mL via INTRAVENOUS

## 2022-01-18 NOTE — ED Provider Triage Note (Signed)
Emergency Medicine Provider Triage Evaluation Note  Wesley Hudson , a 41 y.o. male  was evaluated in triage.  Pt complains of right lower quadrant abdominal pain.  No dysuria, hematuria, scrotal pain.  No flank pain.  Pain began yesterday.  Also located periumbilical region.  No prior abdominal surgeries.  Has had some chills however no documented fever.  No cough, rhinorrhea, shortness of breath.  Seen by urgent care, urinalysis not infected, per patient. Sent here to R/o appendicitis  Review of Systems  Positive: Right lower quadrant abdominal pain, chills Negative: Dysuria, hematuria  Physical Exam  BP (!) 153/126 (BP Location: Right Arm)    Pulse 91    Temp 98.7 F (37.1 C) (Oral)    Resp 14    Ht 6' (1.829 m)    Wt 93.4 kg    SpO2 98%    BMI 27.94 kg/m  Gen:   Awake, no distress   Resp:  Normal effort  MSK:   Moves extremities without difficulty  ABD:  RLQ tenderness Other:    Medical Decision Making  Medically screening exam initiated at 8:00 PM.  Appropriate orders placed.  Wesley Hudson was informed that the remainder of the evaluation will be completed by another provider, this initial triage assessment does not replace that evaluation, and the importance of remaining in the ED until their evaluation is complete.  RLQ pain   Ama Mcmaster A, PA-C 01/18/22 2001

## 2022-01-18 NOTE — ED Provider Notes (Signed)
[facility] DEPT Provider Note   CSN: [id number] Arrival date & time: 01/18/22  1948     History  Chief Complaint  Patient presents with   Abdominal Pain    Wesley BURUCA is a 41 y.o. male.  HPI     This is a 41 year old male with no reported past medical history who presents with right lower quadrant pain.  Patient reports onset of pain yesterday.  Patient states that he has had progressively worsening pain.  He states that the pain sometimes radiates into his right groin.  He has not had any back pain or flank pain.  No hematuria or dysuria.  No scrotal pain or swelling.  States he has not had anything to eat since Monday.  Has had chills without fevers.  States he had 1 similar episode about a year ago and was treated for UTI.  Has not had any upper respiratory symptoms.  Home Medications Prior to Admission medications   Medication Sig Start Date End Date Taking? Authorizing Provider  cetirizine (ZYRTEC) 10 MG chewable tablet Chew 10 mg by mouth daily.   Yes [provider]  ibuprofen (ADVIL) 200 MG tablet Take 200 mg by mouth daily as needed for moderate pain.   Yes [provider]  oxyCODONE-acetaminophen (PERCOCET/ROXICET) 5-325 MG tablet Take 1 tablet by mouth every 6 (six) hours as needed for severe pain. 01/19/22  Yes Cinzia Devos, Barbette Hair, MD  predniSONE (DELTASONE) 20 MG tablet Take 2 tablets (40 mg total) by mouth daily. 01/19/22  Yes Undra Harriman, Barbette Hair, MD  venlafaxine XR (EFFEXOR-XR) 75 MG 24 hr capsule Take 1 capsule (75 mg total) by mouth daily with breakfast. 10/11/21  Yes Arfeen, Arlyce Harman, MD  fluticasone (FLONASE) 50 MCG/ACT nasal spray Place into the nose. Patient not taking: Reported on 01/19/2022 01/26/21 06/22/22  [provider]  hydrOXYzine (VISTARIL) 25 MG capsule TAKE 1 CAPSULE BY MOUTH AT BEDTIME AS NEEDED ANXIETY Patient not taking: Reported on 01/19/2022 02/23/21   Kathlee Nations, MD      Allergies     Patient has no known allergies.    Review of Systems   Review of Systems  Constitutional:  Positive for chills. Negative for fever.  Gastrointestinal:  Positive for abdominal pain and nausea. Negative for constipation, diarrhea and vomiting.  All other systems reviewed and are negative.  Physical Exam Updated Vital Signs BP 123/85    Pulse 80    Temp 98.7 F (37.1 C) (Oral)    Resp 14    Ht 1.829 m (6')    Wt 93.4 kg    SpO2 93%    BMI 27.94 kg/m  Physical Exam Vitals and nursing note reviewed.  Constitutional:      Appearance: He is well-developed. He is not ill-appearing.  HENT:     Head: Normocephalic and atraumatic.  Eyes:     Pupils: Pupils are equal, round, and reactive to light.  Cardiovascular:     Rate and Rhythm: Normal rate and regular rhythm.     Heart sounds: Normal heart sounds. No murmur heard. Pulmonary:     Effort: Pulmonary effort is normal. No respiratory distress.     Breath sounds: Normal breath sounds. No wheezing.  Abdominal:     General: Bowel sounds are normal.     Palpations: Abdomen is soft.     Tenderness: There is abdominal tenderness in the right lower quadrant and suprapubic area. There is no guarding or rebound.  Musculoskeletal:  Cervical back: Neck supple.  Lymphadenopathy:     Cervical: No cervical adenopathy.  Skin:    General: Skin is warm and dry.  Neurological:     Mental Status: He is alert and oriented to person, place, and time.  Psychiatric:        Mood and Affect: Mood normal.    ED Results / Procedures / Treatments   Labs (all labs ordered are listed, but only abnormal results are displayed) Labs Reviewed  CBC WITH DIFFERENTIAL/PLATELET - Abnormal; Notable for the following components:      Result Value   WBC 13.8 (*)    Neutro Abs 10.5 (*)    All other components within normal limits  COMPREHENSIVE METABOLIC PANEL - Abnormal; Notable for the following components:   Sodium 133 (*)    Glucose, Bld 106 (*)    AST  11 (*)    All other components within normal limits  URINALYSIS, ROUTINE W REFLEX MICROSCOPIC - Abnormal; Notable for the following components:   Color, Urine STRAW (*)    Hgb urine dipstick SMALL (*)    Ketones, ur 20 (*)    All other components within normal limits  RESP PANEL BY RT-PCR (FLU A&B, COVID) [id number]  LIPASE, BLOOD  C-REACTIVE PROTEIN    EKG None  Radiology CT ABDOMEN PELVIS W CONTRAST  Result Date: 01/18/2022 CLINICAL DATA:  Right lower quadrant pain for 2 days, initial encounter EXAM: CT ABDOMEN AND PELVIS WITH CONTRAST TECHNIQUE: Multidetector CT imaging of the abdomen and pelvis was performed using the standard protocol following bolus administration of intravenous contrast. RADIATION DOSE REDUCTION: This exam was performed according to the departmental dose-optimization program which includes automated exposure control, adjustment of the mA and/or kV according to patient size and/or use of iterative reconstruction technique. CONTRAST:  129mL OMNIPAQUE IOHEXOL 300 MG/ML  SOLN COMPARISON:  None. FINDINGS: Lower chest: No acute abnormality. Hepatobiliary: Fatty infiltration of the liver is noted. The gallbladder is within normal limits. Pancreas: Unremarkable. No pancreatic ductal dilatation or surrounding inflammatory changes. Spleen: Normal in size without focal abnormality. Adrenals/Urinary Tract: Adrenal glands are within normal limits. Kidneys are unremarkable. No obstructive changes are seen. The bladder is within normal limits. Stomach/Bowel: Scattered diverticular changes noted in the sigmoid colon. No obstructive or inflammatory changes of the proximal colon are seen. The appendix is within normal limits. Stomach is unremarkable. Small bowel demonstrates patency without obstruction. There are however inflammatory changes surrounding multiple loops of mid to distal ileum in the right lower quadrant with some surrounding fluid. The inflammatory changes extend to the adjacent  sigmoid colon. No discrete abscess is seen. There are some changes of wall edema within the small bowel loops which may be related to inflammatory bowel disease. Vascular/Lymphatic: No significant vascular findings are present. No enlarged abdominal or pelvic lymph nodes. Reproductive: Prostate is unremarkable. Other: Mild free fluid is noted surrounding the inflammatory changes in the small bowel and compensatory changes in sigmoid colon. Musculoskeletal: No acute or significant osseous findings. IMPRESSION: Changes in the mid to distal ileum suspicious for inflammatory bowel disease with localized inflammatory change and extension to the adjacent sigmoid colon no definitive abscess is seen. Some mild free fluid is noted however. Appendix is within normal limits. Electronically Signed   By: Inez Catalina M.D.   On: 01/18/2022 21:42    Procedures Procedures    Medications Ordered in ED Medications  predniSONE (DELTASONE) tablet 60 mg (has no administration in time range)  iohexol (OMNIPAQUE) 300  MG/ML solution 100 mL (100 mLs Intravenous Contrast Given 01/18/22 2111)  morphine (PF) 4 MG/ML injection 4 mg (4 mg Intravenous Given 01/19/22 0012)  ondansetron (ZOFRAN) injection 4 mg (4 mg Intravenous Given 01/19/22 0012)    ED Course/ Medical Decision Making/ A&P Clinical Course as of 01/19/22 0224  Wed Jan 19, 2022  0224 Patient's pain is well controlled.  He is able to tolerate fluids.  We will send home on a burst dose of steroids.  Send a message to gastroenterology to expedite outpatient follow-up.  We will send home with a short course of pain medication.  Patient is agreeable to plan. [CH]    Clinical Course User Index [CH] Tanishia Lemaster, Mayer Masker, MD                           Medical Decision Making Amount and/or Complexity of Data Reviewed Labs: ordered.  Risk Prescription drug management.   This patient presents to the ED for concern of right lower quadrant abdominal pain, this involves  an extensive number of treatment options, and is a complaint that carries with it a high risk of complications and morbidity.  The differential diagnosis includes appendicitis, kidney stone, cholecystitis, pyelonephritis  MDM:    Patient presents with right lower quadrant pain.  He is nontoxic and vital signs are reassuring.  He is afebrile.  He is tender on exam without signs of peritonitis.  Labs obtained.  Slight leukocytosis to 13.  CT scan obtained.  CT scan shows inflammation of the terminal ileum.  This is concerning for IBD.  Patient has no known history.  Patient improved with pain and nausea medication.  He was given a dose of prednisone.  Given that he is clinically stable and well-appearing, do not feel he needs admission to the hospital.  Do feel like he needs expedited gastroenterology follow-up and work-up to establish diagnosis.  I sent a message to gastroenterology.  Will discharge home with a burst dose of prednisone and pain medication.  Patient is agreeable to plan. (Labs, imaging)  Labs: I Ordered, and personally interpreted labs.  The pertinent results include: Leukocytosis  Imaging Studies ordered: I ordered imaging studies including CT abdomen I independently visualized and interpreted imaging. I agree with the radiologist interpretation  Additional history obtained from patient.  External records from outside source obtained and reviewed including prior visits  Critical Interventions: IV pain and nausea medication, prednisone  Consultations: I requested consultation with the NA,  and discussed lab and imaging findings as well as pertinent plan - they recommend: N/A  Cardiac Monitoring: The patient was maintained on a cardiac monitor.  I personally viewed and interpreted the cardiac monitored which showed an underlying rhythm of: Normal sinus rhythm  Reevaluation: After the interventions noted above, I reevaluated the patient and found that they have  :improved   Considered admission for: Ongoing pain, management  Social Determinants of Health: Lives independently  Disposition: Discharge  Co morbidities that complicate the patient evaluation  Past Medical History:  Diagnosis Date   Anxiety    Depression      Medicines Meds ordered this encounter  Medications   iohexol (OMNIPAQUE) 300 MG/ML solution 100 mL   morphine (PF) 4 MG/ML injection 4 mg   ondansetron (ZOFRAN) injection 4 mg   predniSONE (DELTASONE) tablet 60 mg   predniSONE (DELTASONE) 20 MG tablet    Sig: Take 2 tablets (40 mg total) by mouth daily.  Dispense:  10 tablet    Refill:  0   oxyCODONE-acetaminophen (PERCOCET/ROXICET) 5-325 MG tablet    Sig: Take 1 tablet by mouth every 6 (six) hours as needed for severe pain.    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

## 2022-01-18 NOTE — ED Triage Notes (Signed)
Pt reports with sharp RLQ abdominal pain since yesterday afternoon.

## 2022-01-19 LAB — URINALYSIS, ROUTINE W REFLEX MICROSCOPIC
Bacteria, UA: NONE SEEN
Bilirubin Urine: NEGATIVE
Glucose, UA: NEGATIVE mg/dL
Ketones, ur: 20 mg/dL — AB
Leukocytes,Ua: NEGATIVE
Nitrite: NEGATIVE
Protein, ur: NEGATIVE mg/dL
Specific Gravity, Urine: 1.026 (ref 1.005–1.030)
pH: 5 (ref 5.0–8.0)

## 2022-01-19 LAB — C-REACTIVE PROTEIN: CRP: 22.3 mg/dL — ABNORMAL HIGH (ref <1.0)

## 2022-01-19 MED ORDER — PREDNISONE 20 MG PO TABS
60.0000 mg | ORAL_TABLET | Freq: Once | ORAL | Status: AC
  Administered 2022-01-19: 60 mg via ORAL
  Filled 2022-01-19: qty 3

## 2022-01-19 MED ORDER — PREDNISONE 20 MG PO TABS: 40.0000 mg | ORAL_TABLET | Freq: Every day | ORAL | 0 refills | Status: AC

## 2022-01-19 MED ORDER — OXYCODONE-ACETAMINOPHEN 5-325 MG PO TABS
1.0000 | ORAL_TABLET | Freq: Four times a day (QID) | ORAL | 0 refills | Status: DC | PRN
Start: 2022-01-19 — End: 2023-03-20

## 2022-01-19 NOTE — Discharge Instructions (Signed)
You were seen today for abdominal pain.  Your CT scan shows evidence of inflammation of the terminal ileum.  This is often seen in people with inflammatory bowel disease such as Crohn's disease or ulcerative colitis.  It is very important you follow-up with gastroenterology.  See the provided contact.  Take prednisone over the next 5 days.  If you have any new or worsening symptoms including fever or worsening pain, you should be reevaluated.

## 2022-01-19 NOTE — ED Notes (Signed)
Pt given urinal and encouraged to get a urine specimen; pt verbalizes understanding

## 2022-01-19 NOTE — ED Notes (Signed)
Pt given ginger ale to drink. 

## 2022-02-11 DIAGNOSIS — K5000 Crohn's disease of small intestine without complications: Secondary | ICD-10-CM | POA: Diagnosis not present

## 2022-02-11 DIAGNOSIS — R935 Abnormal findings on diagnostic imaging of other abdominal regions, including retroperitoneum: Secondary | ICD-10-CM | POA: Diagnosis not present

## 2022-02-21 ENCOUNTER — Other Ambulatory Visit (HOSPITAL_COMMUNITY): Payer: Self-pay | Admitting: Psychiatry

## 2022-02-21 DIAGNOSIS — F419 Anxiety disorder, unspecified: Secondary | ICD-10-CM

## 2022-02-21 DIAGNOSIS — F331 Major depressive disorder, recurrent, moderate: Secondary | ICD-10-CM

## 2022-03-14 ENCOUNTER — Other Ambulatory Visit (HOSPITAL_COMMUNITY): Payer: Self-pay | Admitting: *Deleted

## 2022-03-14 DIAGNOSIS — F331 Major depressive disorder, recurrent, moderate: Secondary | ICD-10-CM

## 2022-03-14 DIAGNOSIS — F419 Anxiety disorder, unspecified: Secondary | ICD-10-CM

## 2022-03-14 MED ORDER — VENLAFAXINE HCL ER 75 MG PO CP24: 75.0000 mg | ORAL_CAPSULE | Freq: Every day | ORAL | 0 refills | Status: DC

## 2022-03-17 ENCOUNTER — Other Ambulatory Visit (HOSPITAL_COMMUNITY): Payer: Self-pay | Admitting: Psychiatry

## 2022-03-17 DIAGNOSIS — F419 Anxiety disorder, unspecified: Secondary | ICD-10-CM

## 2022-03-17 DIAGNOSIS — F331 Major depressive disorder, recurrent, moderate: Secondary | ICD-10-CM

## 2022-03-24 ENCOUNTER — Telehealth (HOSPITAL_BASED_OUTPATIENT_CLINIC_OR_DEPARTMENT_OTHER): Payer: BC Managed Care – PPO | Admitting: Psychiatry

## 2022-03-24 ENCOUNTER — Encounter (HOSPITAL_COMMUNITY): Payer: Self-pay | Admitting: Psychiatry

## 2022-03-24 DIAGNOSIS — F419 Anxiety disorder, unspecified: Secondary | ICD-10-CM | POA: Diagnosis not present

## 2022-03-24 DIAGNOSIS — F331 Major depressive disorder, recurrent, moderate: Secondary | ICD-10-CM | POA: Diagnosis not present

## 2022-03-24 MED ORDER — VENLAFAXINE HCL ER 75 MG PO CP24
75.0000 mg | ORAL_CAPSULE | Freq: Every day | ORAL | 0 refills | Status: DC
Start: 2022-03-24 — End: 2022-06-17

## 2022-03-24 NOTE — Progress Notes (Signed)
Virtual Visit via Telephone Note ? ?I connected with Wesley Hudson on 03/24/22 at 10:20 AM EDT by telephone and verified that I am speaking with the correct person using two identifiers. ? ?Location: ?Patient: Home ?Provider: Home Office ?  ?I discussed the limitations, risks, security and privacy concerns of performing an evaluation and management service by telephone and the availability of in person appointments. I also discussed with the patient that there may be a patient responsible charge related to this service. The patient expressed understanding and agreed to proceed. ? ? ?History of Present Illness: ?Patient is evaluated by phone session.  He is doing well on his medication.  He like his new job.  He has now electric vehicle and that helps his driving and finances.  [place] the [employer].  He like his new job.  6 weeks ago they have another daughter and things are going very well.  They have another 41-year-old daughter who is now in kindergarten.  Patient sleeping good.  He denies any panic attack or any crying spells.  He denies any feeling of hopelessness or worthlessness.  He sleeps good.  He started walking every day.  Recently he had a visit to the emergency room because of abdominal pain.  His labs were stable.  He denies any irritability or any feeling of hopelessness.  He like to keep his current venlafaxine.  He occasionally takes hydroxyzine and he does not need a new prescription at this time.   ? ? ?Past Psychiatric History: Reviewed. ?H/O treatment since age 41 after arrested for drug charges. H/O DUI at age 41. H/O heavy drinking and cannabis use. Saw [name] since school age and given Adderall. H/O depression and anxiety. Tried Zoloft by PCP but stopped after 2 months. No H/O mania, psychosis, hallucination, aggression or violence.   ?  ?Recent Results (from the past 2160 hour(s))  ?Urinalysis, Routine w reflex microscopic Urine, Clean Catch     Status: Abnormal  ? Collection  Time: 01/18/22  1:13 AM  ?Result Value Ref Range  ? Color, Urine STRAW (A) YELLOW  ? APPearance CLEAR CLEAR  ? Specific Gravity, Urine 1.026 1.005 - 1.030  ? pH 5.0 5.0 - 8.0  ? Glucose, UA NEGATIVE NEGATIVE mg/dL  ? Hgb urine dipstick SMALL (A) NEGATIVE  ? Bilirubin Urine NEGATIVE NEGATIVE  ? Ketones, ur 20 (A) NEGATIVE mg/dL  ? Protein, ur NEGATIVE NEGATIVE mg/dL  ? Nitrite NEGATIVE NEGATIVE  ? Leukocytes,Ua NEGATIVE NEGATIVE  ? RBC / HPF 0-5 0 - 5 RBC/hpf  ? WBC, UA 0-5 0 - 5 WBC/hpf  ? Bacteria, UA NONE SEEN NONE SEEN  ?  Comment: Performed at [facility], [address] [place], [place] [address]  ?CBC with Differential     Status: Abnormal  ? Collection Time: 01/18/22  8:23 PM  ?Result Value Ref Range  ? WBC 13.8 (H) 4.0 - 10.5 K/uL  ? RBC 5.04 4.22 - 5.81 MIL/uL  ? Hemoglobin 15.3 13.0 - 17.0 g/dL  ? HCT 46.8 39.0 - 52.0 %  ? MCV 92.9 80.0 - 100.0 fL  ? MCH 30.4 26.0 - 34.0 pg  ? MCHC 32.7 30.0 - 36.0 g/dL  ? RDW 12.6 11.5 - 15.5 %  ? Platelets 267 150 - 400 K/uL  ? nRBC 0.0 0.0 - 0.2 %  ? Neutrophils Relative % 76 %  ? Neutro Abs 10.5 (H) 1.7 - 7.7 K/uL  ? Lymphocytes Relative 16 %  ?  Lymphs Abs 2.2 0.7 - 4.0 K/uL  ? Monocytes Relative 6 %  ? Monocytes Absolute 0.9 0.1 - 1.0 K/uL  ? Eosinophils Relative 1 %  ? Eosinophils Absolute 0.1 0.0 - 0.5 K/uL  ? Basophils Relative 0 %  ? Basophils Absolute 0.1 0.0 - 0.1 K/uL  ? Immature Granulocytes 1 %  ? Abs Immature Granulocytes 0.07 0.00 - 0.07 K/uL  ?  Comment: Performed at [facility], [address], [place], [place] [address]  ?Comprehensive metabolic panel     Status: Abnormal  ? Collection Time: 01/18/22  8:23 PM  ?Result Value Ref Range  ? Sodium 133 (L) 135 - 145 mmol/L  ? Potassium 3.7 3.5 - 5.1 mmol/L  ? Chloride 103 98 - 111 mmol/L  ? CO2 23 22 - 32 mmol/L  ? Glucose, Bld 106 (H) 70 - 99 mg/dL  ?  Comment: Glucose reference range applies only to samples taken after fasting for at least 8 hours.  ? BUN 10 6 - 20  mg/dL  ? Creatinine, Ser 0.75 0.61 - 1.24 mg/dL  ? Calcium 9.1 8.9 - 10.3 mg/dL  ? Total Protein 7.7 6.5 - 8.1 g/dL  ? Albumin 4.2 3.5 - 5.0 g/dL  ? AST 11 (L) 15 - 41 U/L  ? ALT 17 0 - 44 U/L  ? Alkaline Phosphatase 62 38 - 126 U/L  ? Total Bilirubin 1.1 0.3 - 1.2 mg/dL  ? GFR, Estimated >60 >60 mL/min  ?  Comment: (NOTE) ?Calculated using the CKD-EPI Creatinine Equation (2021) ?  ? Anion gap 7 5 - 15  ?  Comment: Performed at [facility], [address] [place], [place] [address]  ?Lipase, blood     Status: None  ? Collection Time: 01/18/22  8:23 PM  ?Result Value Ref Range  ? Lipase 26 11 - 51 U/L  ?  Comment: Performed at [facility], [address] [place], [place] [address]  ?Resp Panel by RT-PCR (Flu A&B, Covid) Nasopharyngeal Swab     Status: None  ? Collection Time: 01/18/22  8:23 PM  ? Specimen: Nasopharyngeal Swab; Nasopharyngeal(NP) swabs in vial transport medium  ?Result Value Ref Range  ? SARS Coronavirus 2 by RT PCR NEGATIVE NEGATIVE  ?  Comment: (NOTE) ?SARS-CoV-2 target nucleic acids are NOT DETECTED. ? ?The SARS-CoV-2 RNA is generally detectable in upper respiratory ?specimens during the acute phase of infection. The lowest ?concentration of SARS-CoV-2 viral copies this assay can detect is ?138 copies/mL. A negative result does not preclude SARS-Cov-2 ?infection and should not be used as the sole basis for treatment or ?other patient management decisions. A negative result may occur with  ?improper specimen collection/handling, submission of specimen other ?than nasopharyngeal swab, presence of viral mutation(s) within the ?areas targeted by this assay, and inadequate number of viral ?copies(<138 copies/mL). A negative result must be combined with ?clinical observations, patient history, and epidemiological ?information. The expected result is Negative. ? ?Fact Sheet for Patients:  ?BloggerCourse.com ? ?Fact Sheet for Healthcare  Providers:  ?SeriousBroker.it ? ?This test is no t yet approved or cleared by the [place] FDA and  ?has been authorized for detection and/or diagnosis of SARS-CoV-2 by ?FDA under an Emergency Use Authorization (EUA). This EUA will remain  ?in effect (meaning this test can be used) for the duration of the ?COVID-19 declaration under Section 564(b)(1) of the Act, 21 ?U.S.C.section 360bbb-3(b)(1), unless the authorization is terminated  ?or revoked sooner.  ? ? ?  ?  Influenza A by PCR NEGATIVE NEGATIVE  ? Influenza B by PCR NEGATIVE NEGATIVE  ?  Comment: (NOTE) ?The Xpert Xpress SARS-CoV-2/FLU/RSV plus assay is intended as an aid ?in the diagnosis of influenza from Nasopharyngeal swab specimens and ?should not be used as a sole basis for treatment. Nasal washings and ?aspirates are unacceptable for Xpert Xpress SARS-CoV-2/FLU/RSV ?testing. ? ?Fact Sheet for Patients: ?BloggerCourse.com ? ?Fact Sheet for Healthcare Providers: ?SeriousBroker.it ? ?This test is not yet approved or cleared by the [place] FDA and ?has been authorized for detection and/or diagnosis of SARS-CoV-2 by ?FDA under an Emergency Use Authorization (EUA). This EUA will remain ?in effect (meaning this test can be used) for the duration of the ?COVID-19 declaration under Section 564(b)(1) of the Act, 21 U.S.C. ?section 360bbb-3(b)(1), unless the authorization is terminated or ?revoked. ? ?Performed at [facility], [address] ?[place], [place] [address] ?  ?C-reactive protein     Status: Abnormal  ? Collection Time: 01/19/22 12:19 AM  ?Result Value Ref Range  ? CRP 22.3 (H) <1.0 mg/dL  ?  Comment: Performed at [facility], [address] [place], [place] [address]  ?  ? ?Psychiatric Specialty Exam: ?Physical Exam  ?Review of Systems  ?Weight 200 lb (90.7 kg).There is no height or weight on file to calculate BMI.  ?General Appearance: NA   ?Eye Contact:  NA  ?Speech:  Clear and Coherent and Normal Rate  ?Volume:  Normal  ?Mood:  Euthymic  ?Affect:  NA  ?Thought Process:  Goal Directed  ?Orientation:  Full (Time, Place, and Person)  ?Thoug

## 2022-03-25 DIAGNOSIS — R933 Abnormal findings on diagnostic imaging of other parts of digestive tract: Secondary | ICD-10-CM | POA: Diagnosis not present

## 2022-03-25 DIAGNOSIS — K5730 Diverticulosis of large intestine without perforation or abscess without bleeding: Secondary | ICD-10-CM | POA: Diagnosis not present

## 2022-03-25 DIAGNOSIS — D175 Benign lipomatous neoplasm of intra-abdominal organs: Secondary | ICD-10-CM | POA: Diagnosis not present

## 2022-03-25 DIAGNOSIS — R194 Change in bowel habit: Secondary | ICD-10-CM | POA: Diagnosis not present

## 2022-03-25 DIAGNOSIS — K648 Other hemorrhoids: Secondary | ICD-10-CM | POA: Diagnosis not present

## 2022-03-25 DIAGNOSIS — R1084 Generalized abdominal pain: Secondary | ICD-10-CM | POA: Diagnosis not present

## 2022-03-25 DIAGNOSIS — D125 Benign neoplasm of sigmoid colon: Secondary | ICD-10-CM | POA: Diagnosis not present

## 2022-03-25 DIAGNOSIS — K6289 Other specified diseases of anus and rectum: Secondary | ICD-10-CM | POA: Diagnosis not present

## 2022-06-17 ENCOUNTER — Encounter (HOSPITAL_COMMUNITY): Payer: Self-pay | Admitting: Psychiatry

## 2022-06-17 ENCOUNTER — Telehealth (HOSPITAL_BASED_OUTPATIENT_CLINIC_OR_DEPARTMENT_OTHER): Payer: BC Managed Care – PPO | Admitting: Psychiatry

## 2022-06-17 DIAGNOSIS — F419 Anxiety disorder, unspecified: Secondary | ICD-10-CM

## 2022-06-17 DIAGNOSIS — F331 Major depressive disorder, recurrent, moderate: Secondary | ICD-10-CM

## 2022-06-17 MED ORDER — VENLAFAXINE HCL ER 75 MG PO CP24: 75.0000 mg | ORAL_CAPSULE | Freq: Every day | ORAL | 0 refills | Status: DC

## 2022-06-17 NOTE — Progress Notes (Signed)
Virtual Visit via Telephone Note  I connected with Wesley Hudson on 06/17/22 at 10:40 AM EDT by telephone and verified that I am speaking with the correct person using two identifiers.  Location: Patient: Home Provider: Home Office   I discussed the limitations, risks, security and privacy concerns of performing an evaluation and management service by telephone and the availability of in person appointments. I also discussed with the patient that there may be a patient responsible charge related to this service. The patient expressed understanding and agreed to proceed.   History of Present Illness: Patient is evaluated by phone session.  He is taking venlafaxine and doing very well.  He really liked his new job, new car and now they have 64-month-old daughter.  Patient enjoys family time and recently had a trip to [place].  He has upcoming travel to [place] in [place] and is looking forward to.  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  He sleeps good.  He denies any irritability, anxiety or any panic attack.  He denies any crying spells or any feeling of hopelessness.  He is trying to lose weight and so far he noticed maybe few pounds weight loss.  He is active.  He has no tremor or shakes or any EPS.  Past Psychiatric History: Reviewed. H/O treatment since age 80 after arrested for drug charges. H/O DUI at age 70. H/O heavy drinking and cannabis use. Saw Dr. Dub Mikes since school age and given Adderall. H/O depression and anxiety. Tried Zoloft by PCP but stopped after 2 months. No H/O mania, psychosis, hallucination, aggression or violence.     Psychiatric Specialty Exam: Physical Exam  Review of Systems  Weight 200 lb (90.7 kg).There is no height or weight on file to calculate BMI.  General Appearance: NA  Eye Contact:  NA  Speech:  Clear and Coherent and Normal Rate  Volume:  Normal  Mood:  Euthymic  Affect:  NA  Thought Process:  Goal Directed   Orientation:  Full (Time, Place, and Person)  Thought Content:  Logical  Suicidal Thoughts:  No  Homicidal Thoughts:  No  Memory:  Immediate;   Good Recent;   Good Remote;   Good  Judgement:  Good  Insight:  Present  Psychomotor Activity:  Normal  Concentration:  Concentration: Good and Attention Span: Good  Recall:  Good  Fund of Knowledge:  Good  Language:  Good  Akathisia:  No  Handed:  Right  AIMS (if indicated):     Assets:  Communication Skills Desire for Improvement Housing Resilience Social Support Talents/Skills Transportation  ADL's:  Intact  Cognition:  WNL  Sleep:   ok      Assessment and Plan: Major depressive disorder, recurrent.  Anxiety.  Patient doing well on venlafaxine 75 mg daily  He occasionally takes hydroxyzine when he feels very nervous or anxious.  Discussed medication side effects and benefits.  Continue venlafaxine 75 mg daily.  Recommended to call us back with any question or any concern.  Follow-up in 3 months.  Follow Up Instructions:    I discussed the assessment and treatment plan with the patient. The patient was provided an opportunity to ask questions and all were answered. The patient agreed with the plan and demonstrated an understanding of the instructions.   The patient was advised to call back or seek an in-person evaluation if the symptoms worsen or if the condition fails to improve as anticipated. Collaboration of  Care: Primary Care Provider AEB notes are available in epic to review.  Patient/Guardian was advised Release of Information must be obtained prior to any record release in order to collaborate their care with an outside provider. Patient/Guardian was advised if they have not already done so to contact the registration department to sign all necessary forms in order for [place] to release information regarding their care.   Consent: Patient/Guardian gives verbal consent for treatment and assignment of benefits for services  provided during this visit. Patient/Guardian expressed understanding and agreed to proceed.    I provided 18 minutes of non-face-to-face time during this encounter.   Cleotis Nipper, MD

## 2022-09-16 ENCOUNTER — Encounter (HOSPITAL_COMMUNITY): Payer: Self-pay | Admitting: Psychiatry

## 2022-09-16 ENCOUNTER — Telehealth (HOSPITAL_BASED_OUTPATIENT_CLINIC_OR_DEPARTMENT_OTHER): Payer: BC Managed Care – PPO | Admitting: Psychiatry

## 2022-09-16 ENCOUNTER — Other Ambulatory Visit (HOSPITAL_COMMUNITY): Payer: Self-pay | Admitting: Psychiatry

## 2022-09-16 DIAGNOSIS — F331 Major depressive disorder, recurrent, moderate: Secondary | ICD-10-CM

## 2022-09-16 DIAGNOSIS — F419 Anxiety disorder, unspecified: Secondary | ICD-10-CM

## 2022-09-16 MED ORDER — VENLAFAXINE HCL ER 75 MG PO CP24: 75.0000 mg | ORAL_CAPSULE | Freq: Every day | ORAL | 0 refills | Status: DC

## 2022-09-16 MED ORDER — HYDROXYZINE PAMOATE 25 MG PO CAPS: ORAL_CAPSULE | ORAL | 0 refills | Status: DC

## 2022-09-16 NOTE — Progress Notes (Signed)
Virtual Visit via Telephone Note  I connected with Wesley Hudson on 09/16/22 at 10:40 AM EDT by telephone and verified that I am speaking with the correct person using two identifiers.  Location: Patient: In Car Provider: Home Office   I discussed the limitations, risks, security and privacy concerns of performing an evaluation and management service by telephone and the availability of in person appointments. I also discussed with the patient that there may be a patient responsible charge related to this service. The patient expressed understanding and agreed to proceed.   History of Present Illness: Patient is evaluated by phone session.  He is doing well on his medication.  Occasionally he takes hydroxyzine to help his sleep and anxiety but overall his depression is stable.  He is going to pull in [place] in December and looking forward to that trip.  His wife working hybrid and there is upcoming trip to [place] and excited because this is the first time they will go to [place] with their 71-monthold daughter.  Patient denies any crying spells or any feeling of hopelessness or worthlessness.  His appetite is okay.  His energy level is good.  He likes his job and denies any major concern or issues.  He has no tremor or shakes or any EPS.  He wants to keep the current dose of venlafaxine and hydroxyzine to take as needed.  Past Psychiatric History: Reviewed. H/O treatment since age [age]after arrested for drug charges. H/O DUI at age 41 H/O heavy drinking and cannabis use. Saw Dr. LSabra Hecksince school age and given Adderall. H/O depression and anxiety. Tried Zoloft by PCP but stopped after 2 months. No H/O mania, psychosis, hallucination, aggression or violence.     Psychiatric Specialty Exam: Physical Exam  Review of Systems  Weight 200 lb (90.7 kg).There is no height or weight on file to calculate BMI.  General Appearance: NA  Eye Contact:  NA  Speech:  Clear and Coherent  Volume:   Normal  Mood:  Euthymic  Affect:  NA  Thought Process:  Goal Directed  Orientation:  Full (Time, Place, and Person)  Thought Content:  Logical  Suicidal Thoughts:  No  Homicidal Thoughts:  No  Memory:  Immediate;   Good Recent;   Good Remote;   Good  Judgement:  Good  Insight:  Present  Psychomotor Activity:  Normal  Concentration:  Concentration: Good and Attention Span: Good  Recall:  Good  Fund of Knowledge:  Good  Language:  Good  Akathisia:  No  Handed:  Right  AIMS (if indicated):     Assets:  Communication Skills Desire for [place]Skills Transportation  ADL's:  Intact  Cognition:  WNL  Sleep:   ok      Assessment and Plan: Major depressive disorder, recurrent.  Anxiety.  Patient is stable on venlafaxine 75 mg daily.  He occasionally take hydroxyzine and need a new prescription.  Discussed medication side effects and benefits.  Recommend to call [facility] if there is any question or any concern.  Follow-up in 3 months.  Follow Up Instructions:    I discussed the assessment and treatment plan with the patient. The patient was provided an opportunity to ask questions and all were answered. The patient agreed with the plan and demonstrated an understanding of the instructions.   The patient was advised to call back or seek an in-person evaluation if the symptoms worsen or if the condition fails to  improve as anticipated.  Collaboration of Care: Other provider involved in patient's care AEB notes are available in epic to review.  Patient/Guardian was advised Release of Information must be obtained prior to any record release in order to collaborate their care with an outside provider. Patient/Guardian was advised if they have not already done so to contact the registration department to sign all necessary forms in order for [place] to release information regarding their care.   Consent: Patient/Guardian gives verbal consent  for treatment and assignment of benefits for services provided during this visit. Patient/Guardian expressed understanding and agreed to proceed.    I provided 17 minutes of non-face-to-face time during this encounter.   Kathlee Nations, MD

## 2022-10-05 DIAGNOSIS — E049 Nontoxic goiter, unspecified: Secondary | ICD-10-CM | POA: Diagnosis not present

## 2022-10-05 DIAGNOSIS — E7800 Pure hypercholesterolemia, unspecified: Secondary | ICD-10-CM | POA: Diagnosis not present

## 2022-10-05 DIAGNOSIS — Z13220 Encounter for screening for lipoid disorders: Secondary | ICD-10-CM | POA: Diagnosis not present

## 2022-10-05 DIAGNOSIS — Z0000 Encounter for general adult medical examination without abnormal findings: Secondary | ICD-10-CM | POA: Diagnosis not present

## 2022-10-05 DIAGNOSIS — Z6829 Body mass index (BMI) 29.0-29.9, adult: Secondary | ICD-10-CM | POA: Diagnosis not present

## 2022-10-05 DIAGNOSIS — F419 Anxiety disorder, unspecified: Secondary | ICD-10-CM | POA: Diagnosis not present

## 2022-10-10 ENCOUNTER — Other Ambulatory Visit (HOSPITAL_COMMUNITY): Payer: Self-pay | Admitting: Psychiatry

## 2022-10-10 DIAGNOSIS — F419 Anxiety disorder, unspecified: Secondary | ICD-10-CM

## 2022-10-17 DIAGNOSIS — E781 Pure hyperglyceridemia: Secondary | ICD-10-CM | POA: Diagnosis not present

## 2022-10-18 ENCOUNTER — Other Ambulatory Visit: Payer: Self-pay | Admitting: Family Medicine

## 2022-10-18 DIAGNOSIS — E049 Nontoxic goiter, unspecified: Secondary | ICD-10-CM

## 2022-10-31 ENCOUNTER — Ambulatory Visit
Admission: RE | Admit: 2022-10-31 | Discharge: 2022-10-31 | Disposition: A | Discharge status: Home / Self Care | Payer: BC Managed Care – PPO | Source: Ambulatory Visit | Attending: Family Medicine | Admitting: Family Medicine

## 2022-10-31 DIAGNOSIS — E041 Nontoxic single thyroid nodule: Secondary | ICD-10-CM | POA: Diagnosis not present

## 2022-10-31 DIAGNOSIS — E049 Nontoxic goiter, unspecified: Secondary | ICD-10-CM

## 2022-12-14 ENCOUNTER — Other Ambulatory Visit (HOSPITAL_COMMUNITY): Payer: Self-pay | Admitting: Psychiatry

## 2022-12-14 DIAGNOSIS — F419 Anxiety disorder, unspecified: Secondary | ICD-10-CM

## 2022-12-14 DIAGNOSIS — F331 Major depressive disorder, recurrent, moderate: Secondary | ICD-10-CM

## 2022-12-16 ENCOUNTER — Telehealth (HOSPITAL_BASED_OUTPATIENT_CLINIC_OR_DEPARTMENT_OTHER): Payer: BC Managed Care – PPO | Admitting: Psychiatry

## 2022-12-16 ENCOUNTER — Encounter (HOSPITAL_COMMUNITY): Payer: Self-pay | Admitting: Psychiatry

## 2022-12-16 DIAGNOSIS — F331 Major depressive disorder, recurrent, moderate: Secondary | ICD-10-CM

## 2022-12-16 DIAGNOSIS — F419 Anxiety disorder, unspecified: Secondary | ICD-10-CM | POA: Diagnosis not present

## 2022-12-16 MED ORDER — VENLAFAXINE HCL ER 75 MG PO CP24: 75.0000 mg | ORAL_CAPSULE | Freq: Every day | ORAL | 0 refills | Status: DC

## 2022-12-16 MED ORDER — HYDROXYZINE PAMOATE 25 MG PO CAPS: ORAL_CAPSULE | ORAL | 0 refills | Status: DC

## 2022-12-16 NOTE — Progress Notes (Signed)
Virtual Visit via Telephone Note  I connected with Rudi Heap on 12/16/22 at 10:00 AM EST by telephone and verified that I am speaking with the correct person using two identifiers.  Location: Patient: Home Provider: Home Office   I discussed the limitations, risks, security and privacy concerns of performing an evaluation and management service by telephone and the availability of in person appointments. I also discussed with the patient that there may be a patient responsible charge related to this service. The patient expressed understanding and agreed to proceed.   History of Present Illness: Patient is evaluated by phone session.  He has been doing well on his medication.  Recently had a blood work at his primary care physician at [place].  His cholesterol 216 and LDL 131.  His job is going well.  He had a quiet Christmas but before he had a good trip to [place] in November with the family.  The patient denies any panic attack, crying spells or any feeling of hopelessness or worthlessness.  He has no tremors, shakes or any EPS.  His reading level is good.  Lately his wife is very busy and was a Engineer, maintenance (IT) at [employer] and he is trying to help her at home.  Patient wants to keep the Effexor which she takes every day but occasionally he takes hydroxyzine when he feels very nervous or anxious.  Patient told they joined membership at [employer] and enjoyed going to the pool with the daughter.   Past Psychiatric History: Reviewed. H/O treatment since age 11 after arrested for drug charges. H/O DUI at age 89. H/O heavy drinking and cannabis use. Saw Dr. Sabra Heck since school age and given Adderall. H/O depression and anxiety. Tried Zoloft by PCP but stopped after 2 months. No H/O mania, psychosis, hallucination, aggression or violence.    Psychiatric Specialty Exam: Physical Exam  Review of Systems  Weight 200 lb (90.7 kg).There is no height or weight on file to calculate BMI.  General  Appearance: NA  Eye Contact:  NA  Speech:  Clear and Coherent and Normal Rate  Volume:  Normal  Mood:  Euthymic  Affect:  NA  Thought Process:  Goal Directed  Orientation:  Full (Time, Place, and Person)  Thought Content:  Logical  Suicidal Thoughts:  No  Homicidal Thoughts:  No  Memory:  Immediate;   Good Recent;   Good Remote;   Good  Judgement:  Good  Insight:  Good  Psychomotor Activity:  Normal  Concentration:  Concentration: Good and Attention Span: Good  Recall:  Good  Fund of Knowledge:  Good  Language:  Good  Akathisia:  No  Handed:  Right  AIMS (if indicated):     Assets:  Communication Skills Desire for Improvement Housing Resilience Social Support Talents/Skills Transportation  ADL's:  Intact  Cognition:  WNL  Sleep:   ok      Assessment and Plan: Major depressive disorder, recurrent.  Anxiety.  Patient is stable on venlafaxine 75 mg daily.  He also like to have a hydroxyzine 25 mg which she takes only as needed for severe anxiety.  I reviewed blood work results.  His cholesterol 216 and LDL 131.  He is trying to lose weight and trying to be more active.  Recommended to call us back if is any question or any concern.  Follow-up in 3 months.  Follow Up Instructions:    I discussed the assessment and treatment plan with the patient. The patient  was provided an opportunity to ask questions and all were answered. The patient agreed with the plan and demonstrated an understanding of the instructions.   The patient was advised to call back or seek an in-person evaluation if the symptoms worsen or if the condition fails to improve as anticipated.  Collaboration of Care: Other provider involved in patient's care AEB notes are available in epic to review.  Patient/Guardian was advised Release of Information must be obtained prior to any record release in order to collaborate their care with an outside provider. Patient/Guardian was advised if they have not  already done so to contact the registration department to sign all necessary forms in order for [place] to release information regarding their care.   Consent: Patient/Guardian gives verbal consent for treatment and assignment of benefits for services provided during this visit. Patient/Guardian expressed understanding and agreed to proceed.    I provided 18 minutes of non-face-to-face time during this encounter.   Kathlee Nations, MD

## 2023-01-10 ENCOUNTER — Other Ambulatory Visit (HOSPITAL_COMMUNITY): Payer: Self-pay | Admitting: Psychiatry

## 2023-01-10 DIAGNOSIS — F419 Anxiety disorder, unspecified: Secondary | ICD-10-CM

## 2023-01-13 DIAGNOSIS — Z79899 Other long term (current) drug therapy: Secondary | ICD-10-CM | POA: Diagnosis not present

## 2023-01-13 DIAGNOSIS — F411 Generalized anxiety disorder: Secondary | ICD-10-CM | POA: Diagnosis not present

## 2023-01-13 DIAGNOSIS — F325 Major depressive disorder, single episode, in full remission: Secondary | ICD-10-CM | POA: Diagnosis not present

## 2023-03-17 ENCOUNTER — Telehealth (HOSPITAL_COMMUNITY): Payer: BC Managed Care – PPO | Admitting: Psychiatry

## 2023-03-20 ENCOUNTER — Telehealth (HOSPITAL_BASED_OUTPATIENT_CLINIC_OR_DEPARTMENT_OTHER): Payer: BC Managed Care – PPO | Admitting: Psychiatry

## 2023-03-20 ENCOUNTER — Encounter (HOSPITAL_COMMUNITY): Payer: Self-pay | Admitting: Psychiatry

## 2023-03-20 DIAGNOSIS — F331 Major depressive disorder, recurrent, moderate: Secondary | ICD-10-CM | POA: Diagnosis not present

## 2023-03-20 DIAGNOSIS — F419 Anxiety disorder, unspecified: Secondary | ICD-10-CM | POA: Diagnosis not present

## 2023-03-20 MED ORDER — VENLAFAXINE HCL ER 75 MG PO CP24: 75.0000 mg | ORAL_CAPSULE | Freq: Every day | ORAL | 0 refills | Status: AC

## 2023-03-20 MED ORDER — HYDROXYZINE PAMOATE 25 MG PO CAPS: ORAL_CAPSULE | ORAL | 0 refills | Status: AC

## 2023-03-20 NOTE — Progress Notes (Signed)
[facility] MD Virtual Progress Note   Patient Location: Work Provider Location: Home Office  I connect with patient by telephone and verified that I am speaking with correct person by using two identifiers. I discussed the limitations of evaluation and management by telemedicine and the availability of in person appointments. I also discussed with the patient that there may be a patient responsible charge related to this service. The patient expressed understanding and agreed to proceed.  Wesley Hudson [id number] 42 y.o.  03/20/2023 8:30 AM  History of Present Illness:  Patient is evaluated by phone session.  He is in the car and going to work.  He reported things are going okay.  Denies any anxiety attack, feeling of hopelessness or worthlessness.  He feels medicine is helping his depression and anxiety.  His job is going well.  He has upcoming trip to [place] and he is excited about it.  His family is doing well.  He does take the hydroxyzine as needed which helps his sleep and lately for allergy symptoms.  Patient lives with his wife and 2 kids.  He has no tremors, shakes or any EPS.  His appetite is okay.  His weight is stable.  Denies any hallucination, paranoia or any suicidal thoughts.  Past Psychiatric History: H/O treatment since age 58 after arrested for drug charges. H/O DUI at age 41. H/O heavy drinking and cannabis use. Saw Dr. Dub Mikes since school age and given Adderall. H/O depression and anxiety. Tried Zoloft by PCP but stopped after 2 months. No H/O mania, psychosis, hallucination, aggression or violence.      Outpatient Encounter Medications as of 03/20/2023  Medication Sig   cetirizine (ZYRTEC) 10 MG chewable tablet Chew 10 mg by mouth daily.   fluticasone (FLONASE) 50 MCG/ACT nasal spray Place into the nose. (Patient not taking: Reported on 01/19/2022)   hydrOXYzine (VISTARIL) 25 MG capsule TAKE 1 CAPSULE BY MOUTH AT BEDTIME AS NEEDED ANXIETY   ibuprofen  (ADVIL) 200 MG tablet Take 200 mg by mouth daily as needed for moderate pain.   oxyCODONE-acetaminophen (PERCOCET/ROXICET) 5-325 MG tablet Take 1 tablet by mouth every 6 (six) hours as needed for severe pain. (Patient not taking: Reported on 03/24/2022)   predniSONE (DELTASONE) 20 MG tablet Take 2 tablets (40 mg total) by mouth daily. (Patient not taking: Reported on 03/24/2022)   venlafaxine XR (EFFEXOR-XR) 75 MG 24 hr capsule Take 1 capsule (75 mg total) by mouth daily with breakfast.   No facility-administered encounter medications on file as of 03/20/2023.    No results found for this or any previous visit (from the past 2160 hour(s)).   Psychiatric Specialty Exam: Physical Exam  Review of Systems  Weight 200 lb (90.7 kg).There is no height or weight on file to calculate BMI.  General Appearance: NA  Eye Contact:  NA  Speech:  Clear and Coherent and Normal Rate  Volume:  Normal  Mood:  Euthymic  Affect:  Appropriate  Thought Process:  Goal Directed  Orientation:  Full (Time, Place, and Person)  Thought Content:  WDL and Logical  Suicidal Thoughts:  No  Homicidal Thoughts:  No  Memory:  Immediate;   Good Recent;   Good Remote;   Good  Judgement:  Good  Insight:  Present  Psychomotor Activity:  Normal  Concentration:  Concentration: Good and Attention Span: Good  Recall:  Good  Fund of Knowledge:  Good  Language:  Good  Akathisia:  No  Handed:  Right  AIMS (if indicated):     Assets:  Communication Skills Desire for Improvement Housing Resilience Social Support Talents/Skills Transportation  ADL's:  Intact  Cognition:  WNL  Sleep:  good     Assessment/Plan: Anxiety - Plan: hydrOXYzine (VISTARIL) 25 MG capsule, venlafaxine XR (EFFEXOR-XR) 75 MG 24 hr capsule  Major depressive disorder, recurrent episode, moderate - Plan: venlafaxine XR (EFFEXOR-XR) 75 MG 24 hr capsule  Patient is stable on his current medication.  Continue hydroxyzine 25 mg as needed for anxiety,  insomnia and lately for allergy symptoms.  Continue venlafaxine 75 mg daily.  Recommend to call us back if is any question or any concern.  Follow-up in 3 months.   Follow Up Instructions:     I discussed the assessment and treatment plan with the patient. The patient was provided an opportunity to ask questions and all were answered. The patient agreed with the plan and demonstrated an understanding of the instructions.   The patient was advised to call back or seek an in-person evaluation if the symptoms worsen or if the condition fails to improve as anticipated.    Collaboration of Care: Other provider involved in patient's care AEB notes are available in epic to review.  Patient/Guardian was advised Release of Information must be obtained prior to any record release in order to collaborate their care with an outside provider. Patient/Guardian was advised if they have not already done so to contact the registration department to sign all necessary forms in order for [place] to release information regarding their care.   Consent: Patient/Guardian gives verbal consent for treatment and assignment of benefits for services provided during this visit. Patient/Guardian expressed understanding and agreed to proceed.     I provided 15 minutes of non face to face time during this encounter.  Note: This document was prepared by [employer] voice dictation technology and any errors that results from this process are unintentional.    Cleotis Nipper, MD 03/20/2023

## 2023-04-21 ENCOUNTER — Other Ambulatory Visit (HOSPITAL_COMMUNITY): Payer: Self-pay | Admitting: Psychiatry

## 2023-04-21 DIAGNOSIS — F419 Anxiety disorder, unspecified: Secondary | ICD-10-CM

## 2023-05-08 IMAGING — CT CT ABD-PELV W/ CM
2 of 5 series · 16 of 46 positions shown, 18 images · IV contrast (agent unspecified)
Comparison: None.

CLINICAL DATA: Right lower quadrant pain for 2 days, initial
encounter

EXAM:
CT ABDOMEN AND PELVIS WITH CONTRAST
TECHNIQUE: Multidetector CT imaging of the abdomen and pelvis was performed
using the standard protocol following bolus administration of
intravenous contrast.

[Series 2: axial st · axial · 0.83mm/px · z∈[-545,-80]mm · 13 of 109 slices shown, 15 images]
[im 8/109  soft-tissue]
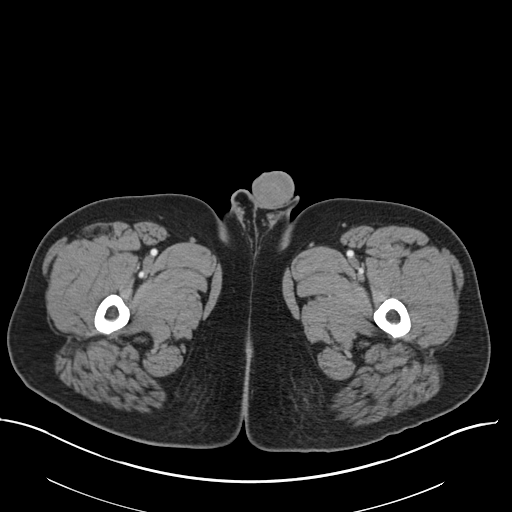
[im 8/109  bone]
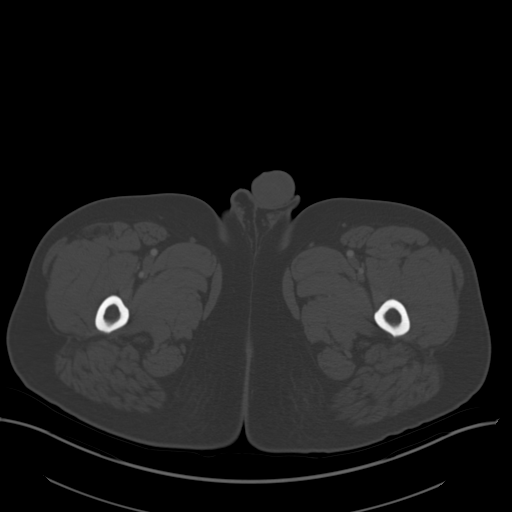
[im 16/109  soft-tissue]
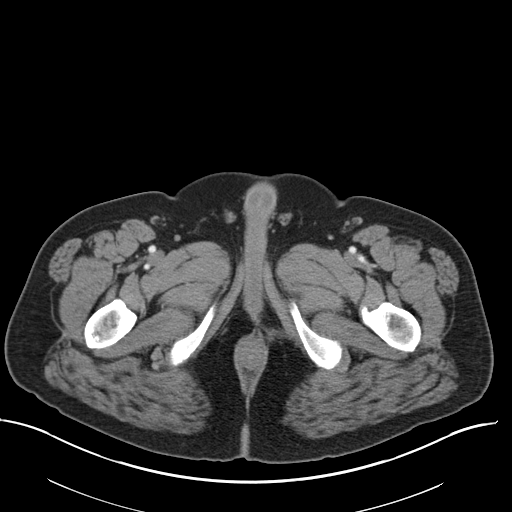
[im 24/109  soft-tissue]
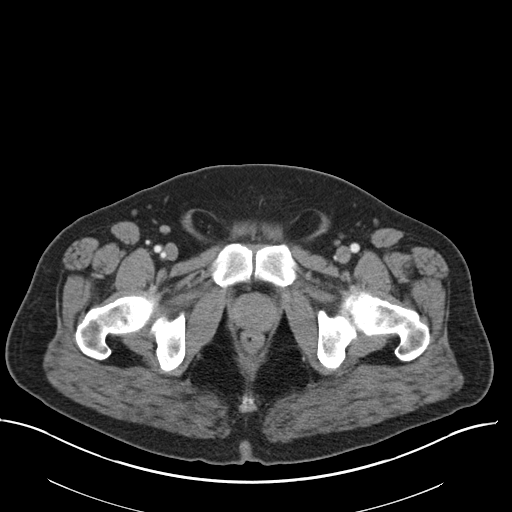
[im 31/109  soft-tissue]
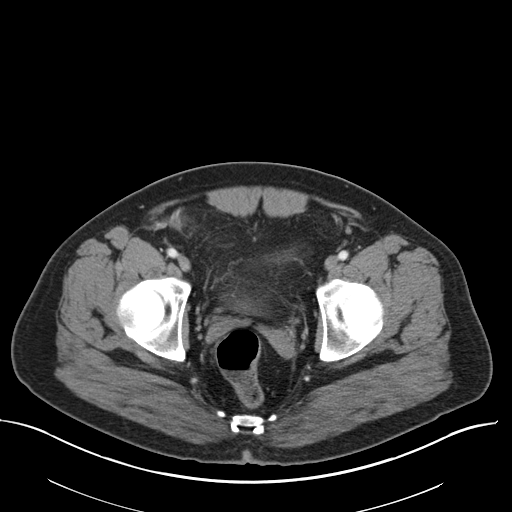
[im 39/109  soft-tissue]
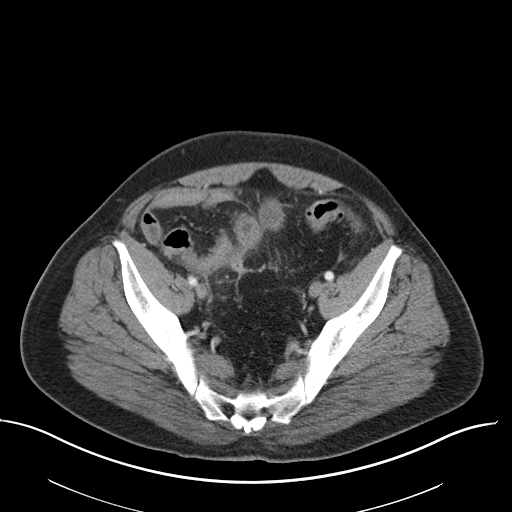
[im 47/109  soft-tissue]
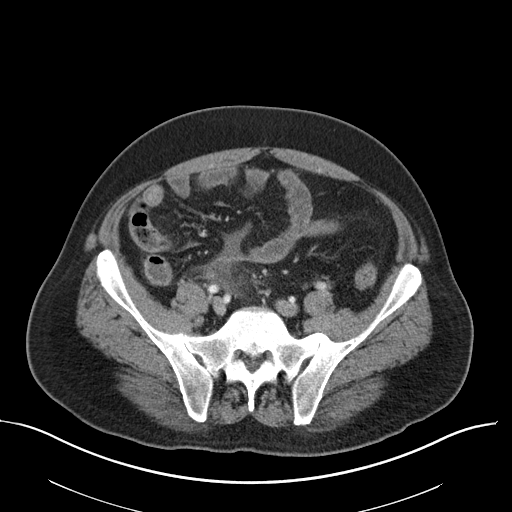
[im 55/109  soft-tissue]
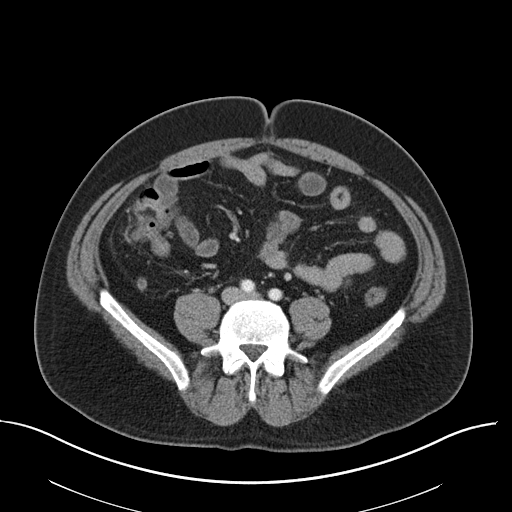
[im 62/109  soft-tissue]
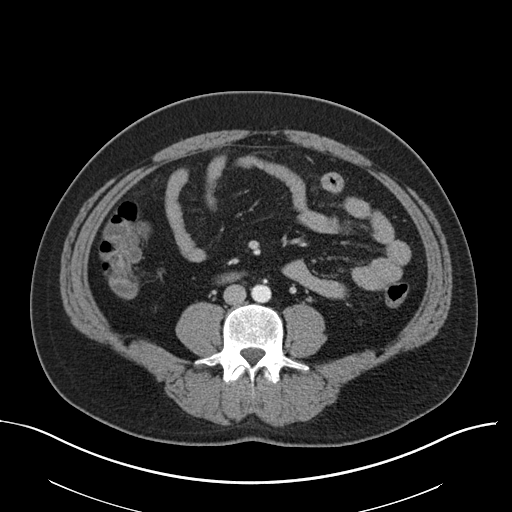
[im 70/109  soft-tissue]
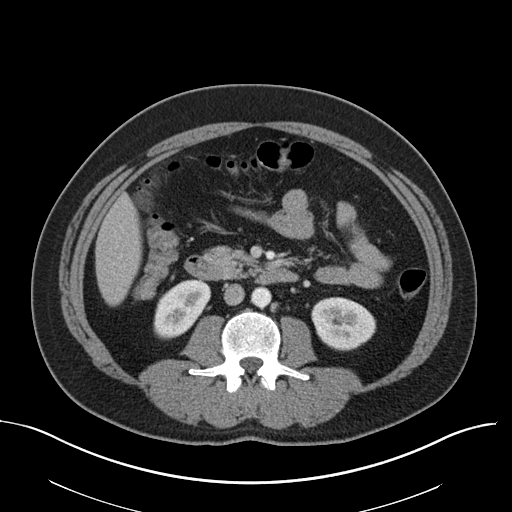
[im 70/109  bone]
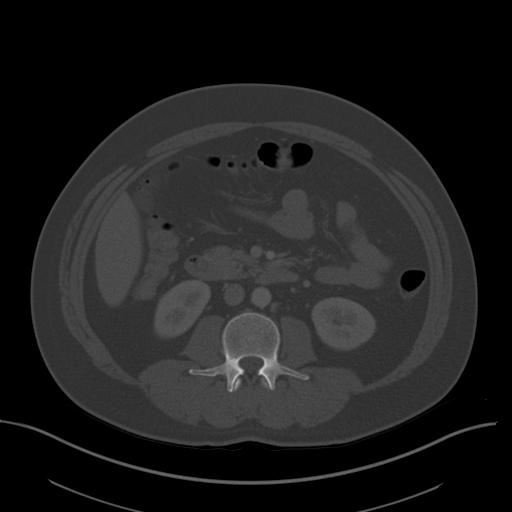
[im 78/109  soft-tissue]
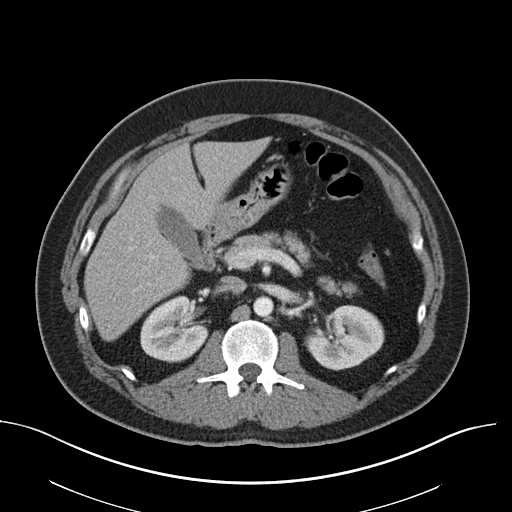
[im 85/109  soft-tissue]
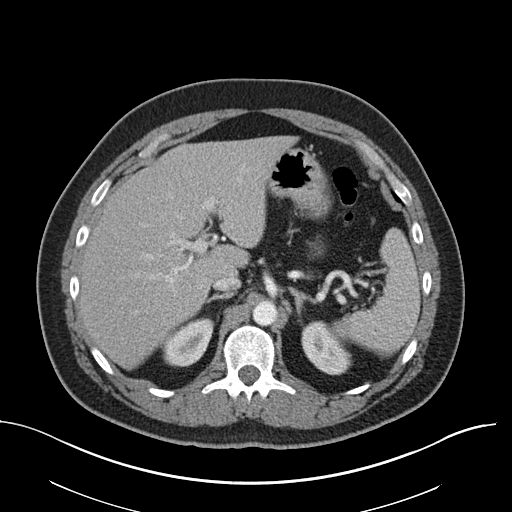
[im 93/109  soft-tissue]
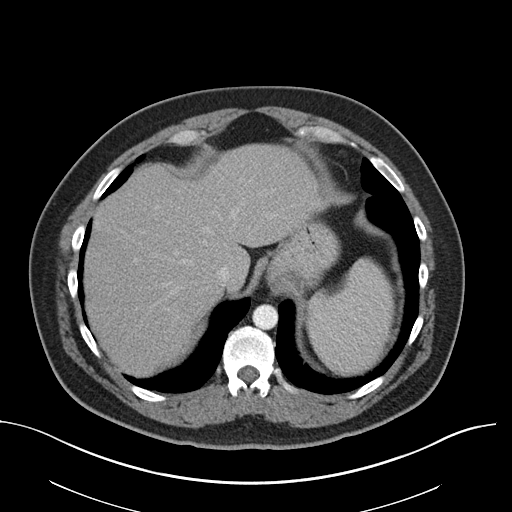
[im 101/109  soft-tissue]
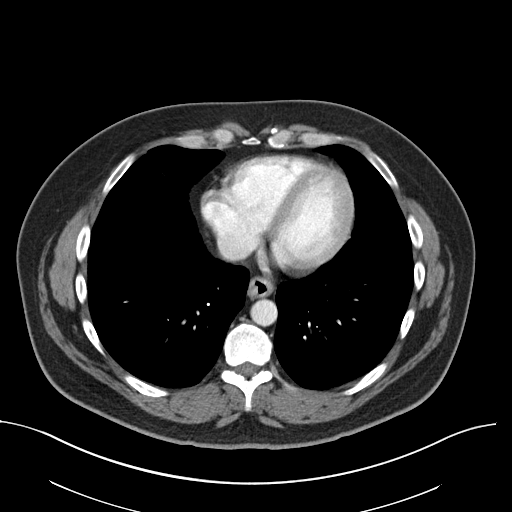

[Series 4: coronal st · coronal · 0.94mm/px · 3 of 156 slices shown]
[im 52/156  soft-tissue]
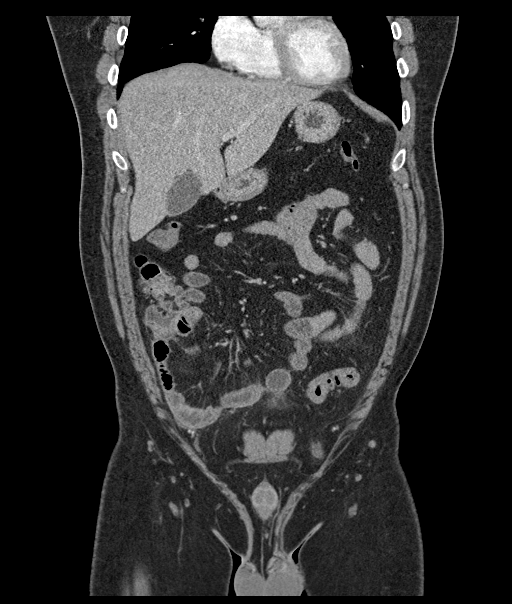
[im 69/156  soft-tissue]
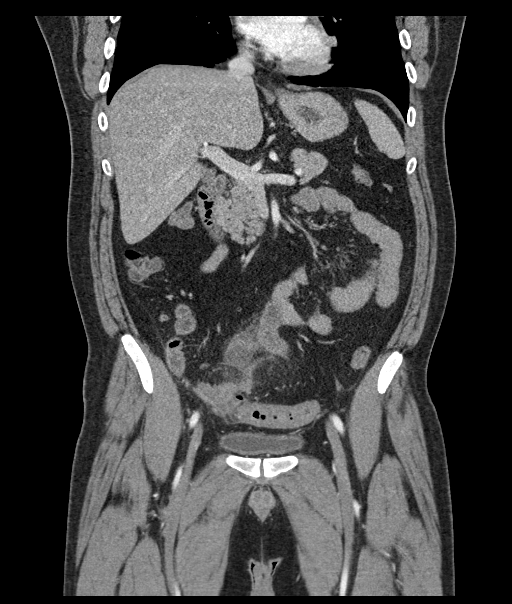
[im 87/156  soft-tissue]
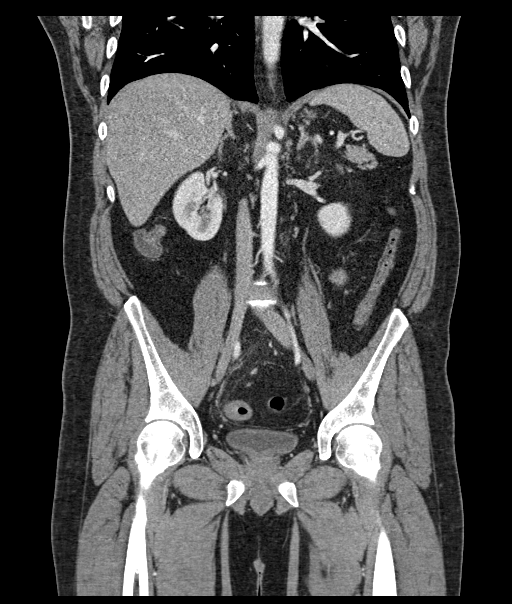

[16 of 46 positions shown; findings below may reference images not displayed]

RADIATION DOSE REDUCTION: This exam was performed according to the
departmental dose-optimization program which includes automated
exposure control, adjustment of the mA and/or kV according to
patient size and/or use of iterative reconstruction technique.

CONTRAST:  100mL OMNIPAQUE IOHEXOL 300 MG/ML  SOLN
FINDINGS: Lower chest: No acute abnormality.

Hepatobiliary: Fatty infiltration of the liver is noted. The
gallbladder is within normal limits.

Pancreas: Unremarkable. No pancreatic ductal dilatation or
surrounding inflammatory changes.

Spleen: Normal in size without focal abnormality.

Adrenals/Urinary Tract: Adrenal glands are within normal limits.
Kidneys are unremarkable. No obstructive changes are seen. The
bladder is within normal limits.

Stomach/Bowel: Scattered diverticular changes noted in the sigmoid
colon. No obstructive or inflammatory changes of the proximal colon
are seen. The appendix is within normal limits. Stomach is
unremarkable. Small bowel demonstrates patency without obstruction.
There are however inflammatory changes surrounding multiple loops of
mid to distal ileum in the right lower quadrant with some
surrounding fluid. The inflammatory changes extend to the adjacent
sigmoid colon. No discrete abscess is seen. There are some changes
of wall edema within the small bowel loops which may be related to
inflammatory bowel disease.

Vascular/Lymphatic: No significant vascular findings are present. No
enlarged abdominal or pelvic lymph nodes.

Reproductive: Prostate is unremarkable.

Other: Mild free fluid is noted surrounding the inflammatory changes
in the small bowel and compensatory changes in sigmoid colon.

Musculoskeletal: No acute or significant osseous findings.
IMPRESSION: Changes in the mid to distal ileum suspicious for inflammatory bowel
disease with localized inflammatory change and extension to the
adjacent sigmoid colon no definitive abscess is seen. Some mild free
fluid is noted however.

Appendix is within normal limits.

## 2023-06-15 ENCOUNTER — Encounter (HOSPITAL_COMMUNITY): Payer: BC Managed Care – PPO | Admitting: Psychiatry

## 2023-06-15 ENCOUNTER — Encounter (HOSPITAL_COMMUNITY): Payer: Self-pay

## 2023-06-15 NOTE — Progress Notes (Signed)
Called patient for appointment and patient replied that he cancelled the appointment on July 11th via text. He reported appointments with psychiatrist are expensive and he is getting medication from PCP. He will call us if he need future appointments.

## 2023-06-19 ENCOUNTER — Telehealth (HOSPITAL_COMMUNITY): Payer: BC Managed Care – PPO | Admitting: Psychiatry

## 2023-06-20 ENCOUNTER — Other Ambulatory Visit (HOSPITAL_COMMUNITY): Payer: Self-pay | Admitting: Psychiatry

## 2023-06-20 DIAGNOSIS — F419 Anxiety disorder, unspecified: Secondary | ICD-10-CM

## 2023-06-20 DIAGNOSIS — F331 Major depressive disorder, recurrent, moderate: Secondary | ICD-10-CM
